# Patient Record
Sex: Male | Born: 1995 | Race: Black or African American | Hispanic: No | Marital: Single | Smoking: Current every day smoker
Health system: Southern US, Community
[De-identification: ages and names within clinical notes are randomized; demographics above are authoritative.]

## PROBLEM LIST (undated history)

## (undated) DIAGNOSIS — Z720 Tobacco use: Secondary | ICD-10-CM

## (undated) DIAGNOSIS — J9859 Other diseases of mediastinum, not elsewhere classified: Secondary | ICD-10-CM

---
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
## 2016-02-17 DIAGNOSIS — W3400XA Accidental discharge from unspecified firearms or gun, initial encounter: Secondary | ICD-10-CM

## 2016-02-17 HISTORY — DX: Accidental discharge from unspecified firearms or gun, initial encounter: W34.00XA

## 2016-10-19 DIAGNOSIS — T1490XA Injury, unspecified, initial encounter: Secondary | ICD-10-CM | POA: Insufficient documentation

## 2020-04-01 ENCOUNTER — Inpatient Hospital Stay (HOSPITAL_COMMUNITY)
Admission: EM | Admit: 2020-04-01 | Discharge: 2020-04-10 | DRG: 820 | Attending: Internal Medicine | Admitting: Internal Medicine

## 2020-04-01 ENCOUNTER — Inpatient Hospital Stay (HOSPITAL_COMMUNITY)

## 2020-04-01 ENCOUNTER — Emergency Department (HOSPITAL_COMMUNITY)

## 2020-04-01 ENCOUNTER — Other Ambulatory Visit: Payer: Self-pay

## 2020-04-01 ENCOUNTER — Encounter (HOSPITAL_COMMUNITY): Payer: Self-pay | Admitting: *Deleted

## 2020-04-01 DIAGNOSIS — C801 Malignant (primary) neoplasm, unspecified: Secondary | ICD-10-CM

## 2020-04-01 DIAGNOSIS — M79604 Pain in right leg: Secondary | ICD-10-CM | POA: Diagnosis present

## 2020-04-01 DIAGNOSIS — C8192 Hodgkin lymphoma, unspecified, intrathoracic lymph nodes: Principal | ICD-10-CM | POA: Diagnosis present

## 2020-04-01 DIAGNOSIS — Z79899 Other long term (current) drug therapy: Secondary | ICD-10-CM

## 2020-04-01 DIAGNOSIS — Z6820 Body mass index (BMI) 20.0-20.9, adult: Secondary | ICD-10-CM

## 2020-04-01 DIAGNOSIS — F17200 Nicotine dependence, unspecified, uncomplicated: Secondary | ICD-10-CM | POA: Diagnosis present

## 2020-04-01 DIAGNOSIS — F913 Oppositional defiant disorder: Secondary | ICD-10-CM | POA: Diagnosis present

## 2020-04-01 DIAGNOSIS — D75839 Thrombocytosis, unspecified: Secondary | ICD-10-CM | POA: Diagnosis present

## 2020-04-01 DIAGNOSIS — J9859 Other diseases of mediastinum, not elsewhere classified: Secondary | ICD-10-CM | POA: Diagnosis present

## 2020-04-01 DIAGNOSIS — F1210 Cannabis abuse, uncomplicated: Secondary | ICD-10-CM | POA: Diagnosis present

## 2020-04-01 DIAGNOSIS — R748 Abnormal levels of other serum enzymes: Secondary | ICD-10-CM | POA: Diagnosis present

## 2020-04-01 DIAGNOSIS — F319 Bipolar disorder, unspecified: Secondary | ICD-10-CM | POA: Diagnosis present

## 2020-04-01 DIAGNOSIS — C8198 Hodgkin lymphoma, unspecified, lymph nodes of multiple sites: Secondary | ICD-10-CM | POA: Diagnosis not present

## 2020-04-01 DIAGNOSIS — I314 Cardiac tamponade: Secondary | ICD-10-CM | POA: Diagnosis present

## 2020-04-01 DIAGNOSIS — Z9889 Other specified postprocedural states: Secondary | ICD-10-CM

## 2020-04-01 DIAGNOSIS — I313 Pericardial effusion (noninflammatory): Secondary | ICD-10-CM | POA: Diagnosis present

## 2020-04-01 DIAGNOSIS — M79605 Pain in left leg: Secondary | ICD-10-CM | POA: Diagnosis present

## 2020-04-01 DIAGNOSIS — Z20822 Contact with and (suspected) exposure to covid-19: Secondary | ICD-10-CM | POA: Diagnosis present

## 2020-04-01 DIAGNOSIS — J90 Pleural effusion, not elsewhere classified: Secondary | ICD-10-CM | POA: Diagnosis present

## 2020-04-01 DIAGNOSIS — R7401 Elevation of levels of liver transaminase levels: Secondary | ICD-10-CM | POA: Diagnosis not present

## 2020-04-01 DIAGNOSIS — Z5111 Encounter for antineoplastic chemotherapy: Secondary | ICD-10-CM | POA: Diagnosis not present

## 2020-04-01 DIAGNOSIS — J95811 Postprocedural pneumothorax: Secondary | ICD-10-CM

## 2020-04-01 DIAGNOSIS — J9811 Atelectasis: Secondary | ICD-10-CM | POA: Diagnosis present

## 2020-04-01 DIAGNOSIS — E46 Unspecified protein-calorie malnutrition: Secondary | ICD-10-CM | POA: Diagnosis present

## 2020-04-01 DIAGNOSIS — C8172 Other classical Hodgkin lymphoma, intrathoracic lymph nodes: Secondary | ICD-10-CM

## 2020-04-01 DIAGNOSIS — G4700 Insomnia, unspecified: Secondary | ICD-10-CM | POA: Diagnosis present

## 2020-04-01 DIAGNOSIS — F411 Generalized anxiety disorder: Secondary | ICD-10-CM | POA: Diagnosis present

## 2020-04-01 DIAGNOSIS — Z7189 Other specified counseling: Secondary | ICD-10-CM

## 2020-04-01 DIAGNOSIS — F909 Attention-deficit hyperactivity disorder, unspecified type: Secondary | ICD-10-CM | POA: Diagnosis present

## 2020-04-01 DIAGNOSIS — B159 Hepatitis A without hepatic coma: Secondary | ICD-10-CM | POA: Diagnosis present

## 2020-04-01 DIAGNOSIS — I3131 Malignant pericardial effusion in diseases classified elsewhere: Secondary | ICD-10-CM

## 2020-04-01 DIAGNOSIS — R000 Tachycardia, unspecified: Secondary | ICD-10-CM | POA: Diagnosis not present

## 2020-04-01 DIAGNOSIS — R609 Edema, unspecified: Secondary | ICD-10-CM | POA: Diagnosis not present

## 2020-04-01 DIAGNOSIS — J9601 Acute respiratory failure with hypoxia: Secondary | ICD-10-CM | POA: Diagnosis present

## 2020-04-01 DIAGNOSIS — D72829 Elevated white blood cell count, unspecified: Secondary | ICD-10-CM | POA: Diagnosis not present

## 2020-04-01 DIAGNOSIS — R0600 Dyspnea, unspecified: Secondary | ICD-10-CM

## 2020-04-01 HISTORY — DX: Other diseases of mediastinum, not elsewhere classified: J98.59

## 2020-04-01 HISTORY — DX: Tobacco use: Z72.0

## 2020-04-01 LAB — COMPREHENSIVE METABOLIC PANEL
ALT: 38 U/L (ref 0–44)
AST: 25 U/L (ref 15–41)
Albumin: 3 g/dL — ABNORMAL LOW (ref 3.5–5.0)
Alkaline Phosphatase: 76 U/L (ref 38–126)
Anion gap: 11 (ref 5–15)
BUN: 8 mg/dL (ref 6–20)
CO2: 27 mmol/L (ref 22–32)
Calcium: 9 mg/dL (ref 8.9–10.3)
Chloride: 102 mmol/L (ref 98–111)
Creatinine, Ser: 0.7 mg/dL (ref 0.61–1.24)
GFR, Estimated: >60 mL/min (ref >60)
Glucose, Bld: 88 mg/dL (ref 70–99)
Potassium: 4.1 mmol/L (ref 3.5–5.1)
Sodium: 140 mmol/L (ref 135–145)
Total Bilirubin: 0.8 mg/dL (ref 0.3–1.2)
Total Protein: 7.5 g/dL (ref 6.5–8.1)

## 2020-04-01 LAB — CBC WITH DIFFERENTIAL/PLATELET
Abs Immature Granulocytes: 0.08 10*3/uL — ABNORMAL HIGH (ref 0.00–0.07)
Basophils Absolute: 0.1 10*3/uL (ref 0.0–0.1)
Basophils Relative: 1 %
Eosinophils Absolute: 0.2 10*3/uL (ref 0.0–0.5)
Eosinophils Relative: 2 %
HCT: 38.4 % — ABNORMAL LOW (ref 39.0–52.0)
Hemoglobin: 11.5 g/dL — ABNORMAL LOW (ref 13.0–17.0)
Immature Granulocytes: 1 %
Lymphocytes Relative: 15 %
Lymphs Abs: 1.3 10*3/uL (ref 0.7–4.0)
MCH: 22.2 pg — ABNORMAL LOW (ref 26.0–34.0)
MCHC: 29.9 g/dL — ABNORMAL LOW (ref 30.0–36.0)
MCV: 74 fL — ABNORMAL LOW (ref 80.0–100.0)
Monocytes Absolute: 1 10*3/uL (ref 0.1–1.0)
Monocytes Relative: 12 %
Neutro Abs: 5.8 10*3/uL (ref 1.7–7.7)
Neutrophils Relative %: 69 %
Platelets: 1053 10*3/uL (ref 150–400)
RBC: 5.19 MIL/uL (ref 4.22–5.81)
RDW: 18.5 % — ABNORMAL HIGH (ref 11.5–15.5)
WBC: 8.4 10*3/uL (ref 4.0–10.5)
nRBC: 0 % (ref 0.0–0.2)

## 2020-04-01 LAB — LACTATE DEHYDROGENASE: LDH: 188 U/L (ref 98–192)

## 2020-04-01 LAB — GLUCOSE, PLEURAL OR PERITONEAL FLUID: Glucose, Fluid: 78 mg/dL

## 2020-04-01 LAB — RESP PANEL BY RT-PCR (FLU A&B, COVID) ARPGX2
Influenza A by PCR: NEGATIVE
Influenza B by PCR: NEGATIVE
SARS Coronavirus 2 by RT PCR: NEGATIVE

## 2020-04-01 LAB — ALBUMIN, PLEURAL OR PERITONEAL FLUID: Albumin, Fluid: 1.8 g/dL

## 2020-04-01 LAB — BODY FLUID CELL COUNT WITH DIFFERENTIAL
Eos, Fluid: 4 %
Lymphs, Fluid: 44 %
Monocyte-Macrophage-Serous Fluid: 20 % — ABNORMAL LOW (ref 50–90)
Neutrophil Count, Fluid: 32 % — ABNORMAL HIGH (ref 0–25)
Total Nucleated Cell Count, Fluid: 283 cu mm (ref 0–1000)

## 2020-04-01 LAB — TSH: TSH: 3.203 u[IU]/mL (ref 0.350–4.500)

## 2020-04-01 LAB — LACTATE DEHYDROGENASE, PLEURAL OR PERITONEAL FLUID: LD, Fluid: 109 U/L — ABNORMAL HIGH (ref 3–23)

## 2020-04-01 LAB — PROTIME-INR
INR: 1.4 — ABNORMAL HIGH (ref 0.8–1.2)
Prothrombin Time: 16.9 seconds — ABNORMAL HIGH (ref 11.4–15.2)

## 2020-04-01 LAB — HIV ANTIBODY (ROUTINE TESTING W REFLEX): HIV Screen 4th Generation wRfx: NONREACTIVE

## 2020-04-01 LAB — C-REACTIVE PROTEIN: CRP: 9.6 mg/dL — ABNORMAL HIGH (ref <1.0)

## 2020-04-01 LAB — SEDIMENTATION RATE: Sed Rate: 34 mm/hr — ABNORMAL HIGH (ref 0–16)

## 2020-04-01 LAB — PROTEIN, PLEURAL OR PERITONEAL FLUID: Total protein, fluid: 4.1 g/dL

## 2020-04-01 MED ORDER — ACETAMINOPHEN 325 MG PO TABS
650.0000 mg | ORAL_TABLET | Freq: Four times a day (QID) | ORAL | Status: DC | PRN
  Administered 2020-04-06 – 2020-04-08 (×2): 650 mg via ORAL
  Filled 2020-04-01 (×2): qty 2

## 2020-04-01 MED ORDER — MORPHINE SULFATE (PF) 2 MG/ML IV SOLN
2.0000 mg | Freq: Once | INTRAVENOUS | Status: AC
  Administered 2020-04-01: 2 mg via INTRAVENOUS
  Filled 2020-04-01: qty 1

## 2020-04-01 MED ORDER — IOHEXOL 300 MG/ML  SOLN
75.0000 mL | Freq: Once | INTRAMUSCULAR | Status: AC | PRN
  Administered 2020-04-01: 75 mL via INTRAVENOUS

## 2020-04-01 MED ORDER — SODIUM CHLORIDE 0.9% FLUSH
3.0000 mL | Freq: Two times a day (BID) | INTRAVENOUS | Status: DC
  Administered 2020-04-01 – 2020-04-10 (×15): 3 mL via INTRAVENOUS

## 2020-04-01 MED ORDER — ALBUTEROL SULFATE HFA 108 (90 BASE) MCG/ACT IN AERS
2.0000 | INHALATION_SPRAY | RESPIRATORY_TRACT | Status: DC | PRN
  Administered 2020-04-01: 2 via RESPIRATORY_TRACT
  Filled 2020-04-01: qty 6.7

## 2020-04-01 MED ORDER — POLYETHYLENE GLYCOL 3350 17 G PO PACK
17.0000 g | PACK | Freq: Every day | ORAL | Status: DC | PRN
  Administered 2020-04-05: 17 g via ORAL
  Filled 2020-04-01: qty 1

## 2020-04-01 MED ORDER — MORPHINE SULFATE (PF) 2 MG/ML IV SOLN
2.0000 mg | INTRAVENOUS | Status: DC | PRN
  Administered 2020-04-01 – 2020-04-05 (×7): 2 mg via INTRAVENOUS
  Filled 2020-04-01 (×11): qty 1

## 2020-04-01 MED ORDER — ACETAMINOPHEN 650 MG RE SUPP: 650.0000 mg | Freq: Four times a day (QID) | RECTAL | Status: DC | PRN

## 2020-04-01 MED ORDER — HYDROCODONE-ACETAMINOPHEN 5-325 MG PO TABS
1.0000 | ORAL_TABLET | ORAL | Status: DC | PRN
  Administered 2020-04-01 (×2): 2 via ORAL
  Administered 2020-04-02 – 2020-04-04 (×2): 1 via ORAL
  Administered 2020-04-05 – 2020-04-06 (×3): 2 via ORAL
  Filled 2020-04-01 (×4): qty 2
  Filled 2020-04-01 (×2): qty 1
  Filled 2020-04-01 (×2): qty 2

## 2020-04-01 MED ORDER — ENOXAPARIN SODIUM 40 MG/0.4ML ~~LOC~~ SOLN
40.0000 mg | SUBCUTANEOUS | Status: DC
  Administered 2020-04-01 – 2020-04-07 (×6): 40 mg via SUBCUTANEOUS
  Filled 2020-04-01 (×10): qty 0.4

## 2020-04-01 MED ORDER — IOHEXOL 300 MG/ML  SOLN
100.0000 mL | Freq: Once | INTRAMUSCULAR | Status: AC | PRN
  Administered 2020-04-01: 100 mL via INTRAVENOUS

## 2020-04-01 NOTE — ED Notes (Signed)
Care assumed. Report received from [place], [place]. Pt states his pain is 7/10, pain medications will be administered per [facility]

## 2020-04-01 NOTE — ED Notes (Signed)
Platelets 1,053, MD Horton notified via phone.
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
## 2020-04-01 NOTE — ED Notes (Signed)
Attempt to call report. RN to call back in 5-10 minutes

## 2020-04-01 NOTE — ED Notes (Signed)
At patient's bedside with Dr. Erin Fulling for Thoracentesis, consent signed by patient.
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
## 2020-04-01 NOTE — ED Notes (Signed)
Begin procedure

## 2020-04-01 NOTE — Procedures (Signed)
Thoracentesis  Procedure Note  Thomas Sanders  [id number]  [birth year]  Date:04/01/20  Time:5:18 PM   Provider Performing:Deniyah Dillavou B Amogh Komatsu   Procedure: Thoracentesis with imaging guidance (35825)  Indication(s) Pleural Effusion  Consent Risks of the procedure as well as the alternatives and risks of each were explained to the patient and/or caregiver.  Consent for the procedure was obtained and is signed in the bedside chart  Anesthesia Topical only with 1% lidocaine    Time Out Verified patient identification, verified procedure, site/side was marked, verified correct patient position, special equipment/implants available, medications/allergies/relevant history reviewed, required imaging and test results available.   Sterile Technique Maximal sterile technique including full sterile barrier drape, hand hygiene, sterile gown, sterile gloves, mask, hair covering, sterile ultrasound probe cover (if used).  Procedure Description Ultrasound was used to identify appropriate pleural anatomy for placement and overlying skin marked.  Area of drainage cleaned and draped in sterile fashion. Lidocaine was used to anesthetize the skin and subcutaneous tissue.  1400 cc's of neon yellow appearing fluid was drained from the right pleural space. Catheter then removed and bandaid applied to site.   Complications/Tolerance None; patient tolerated the procedure well. Chest X-ray is ordered to confirm no post-procedural complication.   EBL Minimal   Specimen(s) Pleural fluid

## 2020-04-01 NOTE — ED Notes (Signed)
End procdure
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
## 2020-04-01 NOTE — ED Triage Notes (Signed)
BIB [facility] from jail due to Right Pleural Effusion, pt states he has been feeling bad since 03/21/20

## 2020-04-02 ENCOUNTER — Inpatient Hospital Stay (HOSPITAL_COMMUNITY)

## 2020-04-02 ENCOUNTER — Encounter (HOSPITAL_COMMUNITY): Payer: Self-pay | Admitting: Internal Medicine

## 2020-04-02 DIAGNOSIS — J9859 Other diseases of mediastinum, not elsewhere classified: Secondary | ICD-10-CM | POA: Diagnosis not present

## 2020-04-02 LAB — BASIC METABOLIC PANEL
Anion gap: 10 (ref 5–15)
BUN: 10 mg/dL (ref 6–20)
CO2: 23 mmol/L (ref 22–32)
Calcium: 8.8 mg/dL — ABNORMAL LOW (ref 8.9–10.3)
Chloride: 102 mmol/L (ref 98–111)
Creatinine, Ser: 0.62 mg/dL (ref 0.61–1.24)
GFR, Estimated: >60 mL/min (ref >60)
Glucose, Bld: 85 mg/dL (ref 70–99)
Potassium: 4.3 mmol/L (ref 3.5–5.1)
Sodium: 135 mmol/L (ref 135–145)

## 2020-04-02 LAB — CBC
HCT: 37.7 % — ABNORMAL LOW (ref 39.0–52.0)
Hemoglobin: 11 g/dL — ABNORMAL LOW (ref 13.0–17.0)
MCH: 21.9 pg — ABNORMAL LOW (ref 26.0–34.0)
MCHC: 29.2 g/dL — ABNORMAL LOW (ref 30.0–36.0)
MCV: 75.1 fL — ABNORMAL LOW (ref 80.0–100.0)
Platelets: 953 10*3/uL (ref 150–400)
RBC: 5.02 MIL/uL (ref 4.22–5.81)
RDW: 18.1 % — ABNORMAL HIGH (ref 11.5–15.5)
WBC: 11.2 10*3/uL — ABNORMAL HIGH (ref 4.0–10.5)
nRBC: 0 % (ref 0.0–0.2)

## 2020-04-02 LAB — TRIGLYCERIDES, BODY FLUIDS: Triglycerides, Fluid: 23 mg/dL

## 2020-04-02 LAB — PATHOLOGIST SMEAR REVIEW

## 2020-04-02 LAB — AMYLASE, PLEURAL OR PERITONEAL FLUID: Amylase, Fluid: 16 U/L

## 2020-04-02 MED ORDER — MIDAZOLAM HCL 2 MG/2ML IJ SOLN
INTRAMUSCULAR | Status: AC
  Filled 2020-04-02: qty 4

## 2020-04-02 MED ORDER — SODIUM CHLORIDE 0.9 % IV SOLN
INTRAVENOUS | Status: AC
  Filled 2020-04-02: qty 250

## 2020-04-02 MED ORDER — FENTANYL CITRATE (PF) 100 MCG/2ML IJ SOLN
INTRAMUSCULAR | Status: AC
  Filled 2020-04-02: qty 4

## 2020-04-02 MED ORDER — FENTANYL CITRATE (PF) 100 MCG/2ML IJ SOLN
INTRAMUSCULAR | Status: AC | PRN
  Administered 2020-04-02 (×2): 50 ug via INTRAVENOUS

## 2020-04-02 MED ORDER — MIDAZOLAM HCL 2 MG/2ML IJ SOLN
INTRAMUSCULAR | Status: AC | PRN
  Administered 2020-04-02 (×2): 2 mg via INTRAVENOUS

## 2020-04-02 MED ORDER — LORAZEPAM 2 MG/ML IJ SOLN
1.0000 mg | Freq: Once | INTRAMUSCULAR | Status: AC
  Administered 2020-04-02: 1 mg via INTRAVENOUS
  Filled 2020-04-02: qty 1

## 2020-04-02 MED ORDER — IPRATROPIUM-ALBUTEROL 0.5-2.5 (3) MG/3ML IN SOLN
3.0000 mL | Freq: Four times a day (QID) | RESPIRATORY_TRACT | Status: DC | PRN
  Administered 2020-04-02 – 2020-04-03 (×3): 3 mL via RESPIRATORY_TRACT
  Filled 2020-04-02 (×3): qty 3

## 2020-04-02 MED ORDER — LORAZEPAM 2 MG/ML IJ SOLN
1.0000 mg | Freq: Four times a day (QID) | INTRAMUSCULAR | Status: DC | PRN
  Administered 2020-04-02 – 2020-04-03 (×3): 1 mg via INTRAVENOUS
  Filled 2020-04-02 (×4): qty 1

## 2020-04-02 MED ORDER — LIDOCAINE HCL (PF) 1 % IJ SOLN
INTRAMUSCULAR | Status: AC | PRN
  Administered 2020-04-02: 10 mL via INTRADERMAL

## 2020-04-02 NOTE — Progress Notes (Signed)
Patient c/o of worsening Shortness of breath and back pain. O2 currently at 98% and patient is on 2L of O2. Paged on call provider. Will continue to monitor.  Provider ordered a new chest xray  PRN morphine 2 mg given.
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
## 2020-04-02 NOTE — Procedures (Signed)
Interventional Radiology Procedure Note  Procedure: CT BX MEDIASTINAL MASS    Complications: None  Estimated Blood Loss:  MIN  Findings: 7 G CORES    Tamera Punt, MD

## 2020-04-02 NOTE — TOC Progression Note (Signed)
Transition of Care [facility]) - Progression Note    Patient Details  Name: Thomas Sanders MRN: [id number] Date of Birth: [birth year]  Transition of Care [facility]) CM/SW Contact  Ross Ludwig, [place] Phone Number: 04/02/2020, 4:54 PM  Clinical Narrative:     CSW received consult for patient being from jail.  CSW will follow if any needs arise.  Plan is for patient to return to jail.        Expected Discharge Plan and Services                                                 Social Determinants of Health (SDOH) Interventions    Readmission Risk Interventions No flowsheet data found.

## 2020-04-02 NOTE — Plan of Care (Signed)
  Problem: Health Behavior/Discharge Planning: Goal: Ability to manage health-related needs will improve Outcome: Progressing   Problem: Clinical Measurements: Goal: Will remain free from infection Outcome: Progressing   Problem: Coping: Goal: Level of anxiety will decrease Outcome: Progressing
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
## 2020-04-02 NOTE — CV Procedure (Signed)
Echocardiogram not completed. First attempt the patient was experiencing shortness of breath and chest pain. Second attempt he was in IR for a biopsy.  Darlina Sicilian RDCS

## 2020-04-02 NOTE — Progress Notes (Signed)
Oncology short note  CT abd/pelvis and CT neck -- no other evidence of metastatic disease ECHO pending IR guided biopsy done today-- pathology pending.  -appreciate excellent hospital medicine cares -oncology will f/u once pathology results available.  Sullivan Lone MD

## 2020-04-03 ENCOUNTER — Inpatient Hospital Stay (HOSPITAL_COMMUNITY)

## 2020-04-03 ENCOUNTER — Encounter (HOSPITAL_COMMUNITY): Admission: EM | Payer: Self-pay | Source: Home / Self Care | Attending: Internal Medicine

## 2020-04-03 ENCOUNTER — Encounter (HOSPITAL_COMMUNITY): Payer: Self-pay | Admitting: Internal Medicine

## 2020-04-03 DIAGNOSIS — D75839 Thrombocytosis, unspecified: Secondary | ICD-10-CM | POA: Diagnosis not present

## 2020-04-03 DIAGNOSIS — I3131 Malignant pericardial effusion in diseases classified elsewhere: Secondary | ICD-10-CM

## 2020-04-03 DIAGNOSIS — I314 Cardiac tamponade: Secondary | ICD-10-CM

## 2020-04-03 DIAGNOSIS — I313 Pericardial effusion (noninflammatory): Secondary | ICD-10-CM | POA: Diagnosis not present

## 2020-04-03 DIAGNOSIS — J9859 Other diseases of mediastinum, not elsewhere classified: Secondary | ICD-10-CM | POA: Diagnosis not present

## 2020-04-03 DIAGNOSIS — J90 Pleural effusion, not elsewhere classified: Secondary | ICD-10-CM | POA: Diagnosis not present

## 2020-04-03 DIAGNOSIS — C801 Malignant (primary) neoplasm, unspecified: Secondary | ICD-10-CM

## 2020-04-03 HISTORY — PX: PERICARDIOCENTESIS: CATH118255

## 2020-04-03 LAB — GRAM STAIN

## 2020-04-03 LAB — BODY FLUID CELL COUNT WITH DIFFERENTIAL
Eos, Fluid: 3 %
Lymphs, Fluid: 30 %
Monocyte-Macrophage-Serous Fluid: 9 % — ABNORMAL LOW (ref 50–90)
Neutrophil Count, Fluid: 58 % — ABNORMAL HIGH (ref 0–25)
Other Cells, Fluid: 1 %
Total Nucleated Cell Count, Fluid: 1040 cu mm — ABNORMAL HIGH (ref 0–1000)

## 2020-04-03 LAB — BLOOD GAS, ARTERIAL
Acid-Base Excess: 0.9 mmol/L (ref 0.0–2.0)
Bicarbonate: 26.2 mmol/L (ref 20.0–28.0)
Drawn by: 422461
FIO2: 32
O2 Content: 3 L/min
O2 Saturation: 94.9 %
Patient temperature: 98.3
pCO2 arterial: 47.5 mmHg (ref 32.0–48.0)
pH, Arterial: 7.359 (ref 7.350–7.450)
pO2, Arterial: 83.9 mmHg (ref 83.0–108.0)

## 2020-04-03 LAB — ECHOCARDIOGRAM COMPLETE
Area-P 1/2: 5.02 cm2
Height: 74.5 in
S' Lateral: 2.5 cm
Weight: 2768 oz

## 2020-04-03 LAB — BASIC METABOLIC PANEL
Anion gap: 10 (ref 5–15)
BUN: 9 mg/dL (ref 6–20)
CO2: 27 mmol/L (ref 22–32)
Calcium: 8.7 mg/dL — ABNORMAL LOW (ref 8.9–10.3)
Chloride: 100 mmol/L (ref 98–111)
Creatinine, Ser: 0.7 mg/dL (ref 0.61–1.24)
GFR, Estimated: >60 mL/min (ref >60)
Glucose, Bld: 100 mg/dL — ABNORMAL HIGH (ref 70–99)
Potassium: 4.4 mmol/L (ref 3.5–5.1)
Sodium: 137 mmol/L (ref 135–145)

## 2020-04-03 LAB — CBC
HCT: 38 % — ABNORMAL LOW (ref 39.0–52.0)
Hemoglobin: 11.4 g/dL — ABNORMAL LOW (ref 13.0–17.0)
MCH: 22.3 pg — ABNORMAL LOW (ref 26.0–34.0)
MCHC: 30 g/dL (ref 30.0–36.0)
MCV: 74.4 fL — ABNORMAL LOW (ref 80.0–100.0)
Platelets: 951 10*3/uL (ref 150–400)
RBC: 5.11 MIL/uL (ref 4.22–5.81)
RDW: 17.8 % — ABNORMAL HIGH (ref 11.5–15.5)
WBC: 14.2 10*3/uL — ABNORMAL HIGH (ref 4.0–10.5)
nRBC: 0 % (ref 0.0–0.2)

## 2020-04-03 LAB — CALCITONIN: Calcitonin: <2 pg/mL (ref 0.0–8.4)

## 2020-04-03 LAB — PROCALCITONIN: Procalcitonin: <0.1 ng/mL

## 2020-04-03 LAB — BRAIN NATRIURETIC PEPTIDE: B Natriuretic Peptide: 22.4 pg/mL (ref 0.0–100.0)

## 2020-04-03 LAB — LACTATE DEHYDROGENASE: LDH: 222 U/L — ABNORMAL HIGH (ref 98–192)

## 2020-04-03 LAB — CHOLESTEROL, BODY FLUID: Cholesterol, Fluid: 55 mg/dL

## 2020-04-03 LAB — MRSA PCR SCREENING: MRSA by PCR: NEGATIVE

## 2020-04-03 LAB — BETA HCG QUANT (REF LAB): hCG Quant: <1 m[IU]/mL (ref 0–3)

## 2020-04-03 SURGERY — PERICARDIOCENTESIS
Anesthesia: LOCAL

## 2020-04-03 MED ORDER — LORAZEPAM 2 MG/ML IJ SOLN: 1.0000 mg | INTRAMUSCULAR | Status: DC

## 2020-04-03 MED ORDER — ADULT MULTIVITAMIN W/MINERALS CH
1.0000 | ORAL_TABLET | Freq: Every day | ORAL | Status: DC
  Administered 2020-04-04 – 2020-04-10 (×7): 1 via ORAL
  Filled 2020-04-03 (×8): qty 1

## 2020-04-03 MED ORDER — SODIUM CHLORIDE 0.9 % IV SOLN: Freq: Once | INTRAVENOUS | Status: DC

## 2020-04-03 MED ORDER — CHLORHEXIDINE GLUCONATE CLOTH 2 % EX PADS
6.0000 | MEDICATED_PAD | Freq: Every day | CUTANEOUS | Status: DC
  Administered 2020-04-03 – 2020-04-09 (×5): 6 via TOPICAL

## 2020-04-03 MED ORDER — HEPARIN (PORCINE) IN NACL 1000-0.9 UT/500ML-% IV SOLN
INTRAVENOUS | Status: AC
  Filled 2020-04-03: qty 500

## 2020-04-03 MED ORDER — ENSURE ENLIVE PO LIQD
237.0000 mL | ORAL | Status: DC
  Administered 2020-04-03 – 2020-04-09 (×7): 237 mL via ORAL

## 2020-04-03 MED ORDER — FENTANYL CITRATE (PF) 100 MCG/2ML IJ SOLN
INTRAMUSCULAR | Status: AC
  Filled 2020-04-03: qty 2

## 2020-04-03 MED ORDER — LIDOCAINE HCL (PF) 1 % IJ SOLN
INTRAMUSCULAR | Status: AC
  Filled 2020-04-03: qty 30

## 2020-04-03 MED ORDER — GUAIFENESIN-DM 100-10 MG/5ML PO SYRP
5.0000 mL | ORAL_SOLUTION | ORAL | Status: AC | PRN
  Administered 2020-04-04 (×4): 5 mL via ORAL
  Filled 2020-04-03 (×4): qty 5

## 2020-04-03 MED ORDER — PROSOURCE PLUS PO LIQD
30.0000 mL | Freq: Every day | ORAL | Status: DC
  Administered 2020-04-06 – 2020-04-07 (×2): 30 mL via ORAL
  Filled 2020-04-03 (×3): qty 30

## 2020-04-03 MED ORDER — METHYLPREDNISOLONE SODIUM SUCC 40 MG IJ SOLR
40.0000 mg | Freq: Two times a day (BID) | INTRAMUSCULAR | Status: DC
  Administered 2020-04-03 – 2020-04-05 (×5): 40 mg via INTRAVENOUS
  Filled 2020-04-03 (×5): qty 1

## 2020-04-03 MED ORDER — HEPARIN (PORCINE) IN NACL 1000-0.9 UT/500ML-% IV SOLN
INTRAVENOUS | Status: DC | PRN
  Administered 2020-04-03: 500 mL

## 2020-04-03 MED ORDER — LIDOCAINE HCL (PF) 1 % IJ SOLN
INTRAMUSCULAR | Status: DC | PRN
  Administered 2020-04-03: 10 mL

## 2020-04-03 MED ORDER — FENTANYL CITRATE (PF) 100 MCG/2ML IJ SOLN
INTRAMUSCULAR | Status: DC | PRN
  Administered 2020-04-03: 25 ug via INTRAVENOUS

## 2020-04-03 MED ORDER — ORAL CARE MOUTH RINSE
15.0000 mL | Freq: Two times a day (BID) | OROMUCOSAL | Status: DC
  Administered 2020-04-03 – 2020-04-04 (×2): 15 mL via OROMUCOSAL

## 2020-04-03 MED ORDER — LORAZEPAM 2 MG/ML IJ SOLN
1.0000 mg | Freq: Once | INTRAMUSCULAR | Status: AC
  Administered 2020-04-03: 1 mg via INTRAVENOUS

## 2020-04-03 MED ORDER — BOOST / RESOURCE BREEZE PO LIQD CUSTOM
1.0000 | ORAL | Status: DC
  Administered 2020-04-04 – 2020-04-10 (×5): 1 via ORAL

## 2020-04-03 MED ORDER — LORAZEPAM 2 MG/ML IJ SOLN
1.0000 mg | INTRAMUSCULAR | Status: DC | PRN
  Administered 2020-04-04 – 2020-04-09 (×3): 1 mg via INTRAVENOUS
  Filled 2020-04-03 (×3): qty 1

## 2020-04-03 SURGICAL SUPPLY — 7 items
CATH ANGIO 5F PIGTAIL 65CM (CATHETERS) ×2 IMPLANT
KIT PERIVAC ST CATH 8.3FR (TRAY / TRAY PROCEDURE) ×2 IMPLANT
PACK CARDIAC CATHETERIZATION (CUSTOM PROCEDURE TRAY) ×2 IMPLANT
PROTECTION STATION PRESSURIZED (MISCELLANEOUS) ×2
SHEATH PINNACLE 4F 10CM (SHEATH) ×2 IMPLANT
STATION PROTECTION PRESSURIZED (MISCELLANEOUS) ×1 IMPLANT
WIRE ROSEN-J .035X260CM (WIRE) ×2 IMPLANT

## 2020-04-03 NOTE — Progress Notes (Signed)
Called by nursing to assess drainage around recent pericardiocentesis site. Pericardial drain is in place. There is oozing around the incision site. This appears to be slow and is heavy per nursing report. Unfortunately he had a difficult procedure with difficulty advancing the catheter at the incision site. I suspect the access flows due to enlargement of the puncture site. Hopefully this will slow overnight. Would recommend to continue with gauze dressings to the affected area. Would recommend to continue to check the site periodically every 2 hours. If there is a change in his vital status including hypotension, worsening shortness of breath or worsening tachycardia, please notify cardiology on-call. Furthermore, if you have concerns about leakage around the drain site please discuss with cardiology on-call.  Thomas Bells T. Audie Box, MD, [place]  [address] [place], [place] [address] [phone]  6:53 PM

## 2020-04-03 NOTE — Progress Notes (Signed)
Echocardiogram 2D Echocardiogram has been performed as part of pericardiocentesis.  [place] Deputy Ritaj Dullea 04/03/2020, 5:14 PM

## 2020-04-03 NOTE — Progress Notes (Signed)
Audible wheezing lessoned. Watching TV

## 2020-04-03 NOTE — Interval H&P Note (Signed)
History and Physical Interval Note:  04/03/2020 3:44 PM  Thomas Sanders  has presented today for surgery, with the diagnosis of pericardial effusion.  The various methods of treatment have been discussed with the patient and family. After consideration of risks, benefits and other options for treatment, the patient has consented to  Procedure(s): PERICARDIOCENTESIS (N/A) as a surgical intervention.  The patient's history has been reviewed, patient examined, no change in status, stable for surgery.  I have reviewed the patient's chart and labs.  Questions were answered to the patient's satisfaction.     Kathlyn [place]
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
## 2020-04-03 NOTE — Progress Notes (Signed)
Oncology short note  Chart reviewed. Patient with cardiac tamponade on ECHO -- transfer to [facility].  Called and discussed prelim pathology result with Dr. Vic Ripper --does not appear to be a germ cell tumor and does not appear to be a carcinoma.  It has been referred to Dr. Davina Poke from hematopathology side.  Cannot rule out lymphoma versus thymic tumor versus other etiology.  Additional testing pending no final pathology results available yet.  Pleural fluid differential not overtly lymphocytic. LDH not elevated. Somewhat elevated sedimentation rate and CRP.  PLAN -If no contraindications from a cardiology standpoint it would be reasonable to start the patient empirically on prednisone 60 mg p.o.(of Dexamethasone 10mg  q12h if cannot tolerate po) daily to try to shrink possible lymphoma.   -oncology awaiting final pathology results to define definitive treatment plan.  Sullivan Lone MD MS

## 2020-04-03 NOTE — Progress Notes (Signed)
Patient being transferred to [facility] due to patient condition. Discussed with CCM MD and Hospitalist aware. [facility] at bedside now to transfer. Patient going with his IV access still in place, [facility] RN aware and report given to her for transfer. If need to reach this RN for report once at [facility] please call [phone].
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
## 2020-04-03 NOTE — Progress Notes (Addendum)
04/03/2020   S:  I have seen and evaluated the patient for worsening respiratory status. Has mediastinal mass, malignant pleural and pericardial effusions.  Path on mass pending.  Worsening resp status despite thoracentesis and echo showing clear tamponade so sent to [facility].  O: Blood pressure 129/81, pulse (!) 124, temperature (!) 97.5 F (36.4 C), temperature source Oral, resp. rate (!) 33, height 6' 2.5" (1.892 m), weight 78.5 kg, SpO2 94 %.  Anxious young man in mild resp distress tripoding Absent breath sounds on R Heart sounds distant Ext warm Mentating  A:  Respiratory distress and worsening hemodynamics secondary to tamponade and airway compression from anterior mediastinal mass with pleural/pericardial involvement. Primary differential is thymic, lymphoma.   P:  Trial of pericardial drain sitting upright, he is unable to lie flat  If worsening respiratory status would do brief [facility] ECMO cannulation as intubating him would likely lead to complete cardiovascular collapse.  Cannulas can then be removed once tamponade is resolved.  Will do pleural drain once out of cath lab.  F/u mass path  Additional 45 mins cc time discussing case with ECMO team, cardiology, and Dr. Erin Fulling.  04/03/2020 Erskine Emery MD  Update: patient would like to hold off on pleural drain this evening.
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
## 2020-04-03 NOTE — Progress Notes (Signed)
  Echocardiogram 2D Echocardiogram has been performed.  Jennette Dubin 04/03/2020, 8:30 AM

## 2020-04-03 NOTE — Progress Notes (Signed)
    BRIEF OVERNIGHT PROGRESS REPORT    SUBJECTIVE: Patient with worsening SOB per RRT. Noted to be tachypneic, tachycardic and restless despite PRN administration of Lorazepam, morphine and bronchodilators.  OBJECTIVE: He is  afebrile with blood pressure 144/98 mm Hg and pulse rate 121 beats/min. There were no focal neurological deficits; he was alert and oriented x4, but restless with increased work of breathing.  ASSESSMENT & PLAN:  Acute Respiratory Failure secondary to right pleural effusion in a patient with Large anterior mediastinal mass and near complete right lung collapse as resultant effect of endobronchial compression. -S/p thoracentesis 2/14 by pulmonology - exudative effusion by lights - follow cytology, PH, fungal culture, body fluid culture, cholesterol body fluid -S/p CT guided biopsy  -Transfer to stepdown -Supplemental O2 as needed to maintain O2 saturations 88 to 92% -High risk for intubation -Follow intermittent ABG and chest x-ray as needed -As needed bronchodilators -Oncology following -PCCM consulted will evaluate for furthr management        Rufina Falco, BSN, MSN, DNP, CCRN,FNP-C  [facility] Nurse Practitioner  [facility]

## 2020-04-03 NOTE — Progress Notes (Signed)
[facility] Progress Note Patient Name: Thomas Sanders DOB: 1995/02/19 MRN: [id number]   Date of Service  04/03/2020  HPI/Events of Note  Patient with bouts of coughing, he is requesting a cough suppressant.  eICU Interventions  Robitussin DM ordered PRN cough.        Frederik Pear 04/03/2020, 11:38 PM

## 2020-04-03 NOTE — Progress Notes (Signed)
Audible wheezing ?

## 2020-04-04 ENCOUNTER — Encounter (HOSPITAL_COMMUNITY): Payer: Self-pay | Admitting: Cardiovascular Disease

## 2020-04-04 ENCOUNTER — Inpatient Hospital Stay (HOSPITAL_COMMUNITY)

## 2020-04-04 DIAGNOSIS — I313 Pericardial effusion (noninflammatory): Secondary | ICD-10-CM

## 2020-04-04 DIAGNOSIS — J9601 Acute respiratory failure with hypoxia: Secondary | ICD-10-CM

## 2020-04-04 DIAGNOSIS — C801 Malignant (primary) neoplasm, unspecified: Secondary | ICD-10-CM

## 2020-04-04 DIAGNOSIS — R000 Tachycardia, unspecified: Secondary | ICD-10-CM

## 2020-04-04 DIAGNOSIS — D72829 Elevated white blood cell count, unspecified: Secondary | ICD-10-CM

## 2020-04-04 DIAGNOSIS — J90 Pleural effusion, not elsewhere classified: Secondary | ICD-10-CM

## 2020-04-04 DIAGNOSIS — D75839 Thrombocytosis, unspecified: Secondary | ICD-10-CM | POA: Diagnosis not present

## 2020-04-04 DIAGNOSIS — I314 Cardiac tamponade: Secondary | ICD-10-CM

## 2020-04-04 DIAGNOSIS — J9859 Other diseases of mediastinum, not elsewhere classified: Secondary | ICD-10-CM | POA: Diagnosis not present

## 2020-04-04 LAB — BASIC METABOLIC PANEL
Anion gap: 8 (ref 5–15)
BUN: 7 mg/dL (ref 6–20)
CO2: 30 mmol/L (ref 22–32)
Calcium: 8.8 mg/dL — ABNORMAL LOW (ref 8.9–10.3)
Chloride: 98 mmol/L (ref 98–111)
Creatinine, Ser: 0.72 mg/dL (ref 0.61–1.24)
GFR, Estimated: >60 mL/min (ref >60)
Glucose, Bld: 113 mg/dL — ABNORMAL HIGH (ref 70–99)
Potassium: 4.4 mmol/L (ref 3.5–5.1)
Sodium: 136 mmol/L (ref 135–145)

## 2020-04-04 LAB — CBC
HCT: 39.8 % (ref 39.0–52.0)
Hemoglobin: 11.8 g/dL — ABNORMAL LOW (ref 13.0–17.0)
MCH: 22 pg — ABNORMAL LOW (ref 26.0–34.0)
MCHC: 29.6 g/dL — ABNORMAL LOW (ref 30.0–36.0)
MCV: 74.3 fL — ABNORMAL LOW (ref 80.0–100.0)
Platelets: 893 10*3/uL — ABNORMAL HIGH (ref 150–400)
RBC: 5.36 MIL/uL (ref 4.22–5.81)
RDW: 18 % — ABNORMAL HIGH (ref 11.5–15.5)
WBC: 14.6 10*3/uL — ABNORMAL HIGH (ref 4.0–10.5)
nRBC: 0 % (ref 0.0–0.2)

## 2020-04-04 LAB — PROTEIN, BODY FLUID (OTHER): Total Protein, Body Fluid Other: 4.7 g/dL

## 2020-04-04 LAB — SURGICAL PATHOLOGY

## 2020-04-04 LAB — PH, BODY FLUID
pH, Body Fluid: 7.5
pH, Body Fluid: 7.6

## 2020-04-04 LAB — ECHOCARDIOGRAM LIMITED
Height: 74.5 in
Height: 74.5 in
Weight: 2768 oz
Weight: 2768 oz

## 2020-04-04 LAB — GLUCOSE, BODY FLUID OTHER: Glucose, Body Fluid Other: 86 mg/dL

## 2020-04-04 LAB — AFP TUMOR MARKER: AFP, Serum, Tumor Marker: 2 ng/mL (ref 0.0–8.3)

## 2020-04-04 LAB — LD, BODY FLUID (OTHER): LD, Body Fluid: 355 IU/L

## 2020-04-04 LAB — CYTOLOGY - NON PAP

## 2020-04-04 MED ORDER — SERTRALINE HCL 25 MG PO TABS
50.0000 mg | ORAL_TABLET | Freq: Every day | ORAL | Status: DC
  Filled 2020-04-04 (×2): qty 1
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
## 2020-04-04 NOTE — Progress Notes (Signed)
Brief Oncology Note:  I called pathology to follow-up on the biopsy result.  Biopsy is still pending reviewed by Dr. Melina Copa.  No additional information was available today.  We will follow up with pathology again tomorrow.  Continue current steroids.   Mikey Bussing, DNP, AGPCNP-BC, AOCNP

## 2020-04-04 NOTE — Progress Notes (Addendum)
Cardiology Progress Note  Patient ID: Thomas Sanders MRN: [id number] DOB: [birth year] Date of Encounter: 04/04/2020  Primary Cardiologist: No primary care provider on file.  Subjective   Chief Complaint: Shortness of breath  HPI: 250 cc plus of output from a pericardial drain.  Feeling better.  Tachycardia improved.  Still a lot of drainage oozing from the incision site.  ROS:  All other ROS reviewed and negative. Pertinent positives noted in the HPI.     Inpatient Medications  Scheduled Meds: . (feeding supplement) PROSource Plus  30 mL Oral Daily  . Chlorhexidine Gluconate Cloth  6 each Topical Daily  . enoxaparin (LOVENOX) injection  40 mg Subcutaneous Q24H  . feeding supplement  1 Container Oral Q24H  . feeding supplement  237 mL Oral Q24H  . mouth rinse  15 mL Mouth Rinse BID  . methylPREDNISolone (SOLU-MEDROL) injection  40 mg Intravenous Q12H  . multivitamin with minerals  1 tablet Oral Daily  . sodium chloride flush  3 mL Intravenous Q12H   Continuous Infusions:  PRN Meds: acetaminophen **OR** acetaminophen, guaiFENesin-dextromethorphan, HYDROcodone-acetaminophen, ipratropium-albuterol, LORazepam, morphine injection, polyethylene glycol   Vital Signs   Vitals:   04/04/20 0500 04/04/20 0600 04/04/20 0700 04/04/20 0757  BP: 116/77 134/80 122/84   Pulse: (!) 106 (!) 101 92   Resp: 20 (!) 24 (!) 26   Temp:    98 F (36.7 C)  TempSrc:    Oral  SpO2: 99% 99% 98%   Weight:      Height:        Intake/Output Summary (Last 24 hours) at 04/04/2020 0817 Last data filed at 04/04/2020 0998 Gross per 24 hour  Intake 1200 ml  Output 2760 ml  Net -1560 ml   Last 3 Weights 04/01/2020  Weight (lbs) 173 lb  Weight (kg) 78.472 kg      Telemetry  Overnight telemetry shows sinus tachycardia in the low one hundreds, which I personally reviewed.   Physical Exam   Vitals:   04/04/20 0500 04/04/20 0600 04/04/20 0700 04/04/20 0757  BP: 116/77 134/80 122/84   Pulse: (!) 106  (!) 101 92   Resp: 20 (!) 24 (!) 26   Temp:    98 F (36.7 C)  TempSrc:    Oral  SpO2: 99% 99% 98%   Weight:      Height:         Intake/Output Summary (Last 24 hours) at 04/04/2020 0817 Last data filed at 04/04/2020 3382 Gross per 24 hour  Intake 1200 ml  Output 2760 ml  Net -1560 ml    Last 3 Weights 04/01/2020  Weight (lbs) 173 lb  Weight (kg) 78.472 kg    Body mass index is 21.91 kg/m.  General: Ill-appearing, tachypnea noted Head: Atraumatic, normal size  Eyes: PEERLA, EOMI  Neck: Supple, JVD 7 to 8 cm of water Endocrine: No thryomegaly Cardiac: Normal S1, S2; RRR; no murmurs, rubs, or gallops, distant heart sounds  Lungs: Diminished breath sounds in the left lung fields, absent breath sounds in the right lung field Abd: Soft, nontender, no hepatomegaly, pericardial drain in place in the upper epigastric area, oozing noted from the incision site Ext: No edema, pulses 2+ Musculoskeletal: No deformities, BUE and BLE strength normal and equal Skin: Warm and dry, no rashes   Neuro: Alert and oriented to person, place, time, and situation, CNII-XII grossly intact, no focal deficits  Psych: Normal mood and affect   Labs  High Sensitivity Troponin:  No results for  input(s): TROPONINIHS in the last 720 hours.   Cardiac EnzymesNo results for input(s): TROPONINI in the last 168 hours. No results for input(s): TROPIPOC in the last 168 hours.  Chemistry Recent Labs  Lab 04/01/20 1203 04/02/20 0525 04/03/20 0526 04/04/20 0006  NA 140 135 137 136  K 4.1 4.3 4.4 4.4  CL 102 102 100 98  CO2 27 23 27 30   GLUCOSE 88 85 100* 113*  BUN 8 10 9 7   CREATININE 0.70 0.62 0.70 0.72  CALCIUM 9.0 8.8* 8.7* 8.8*  PROT 7.5  --   --   --   ALBUMIN 3.0*  --   --   --   AST 25  --   --   --   ALT 38  --   --   --   ALKPHOS 76  --   --   --   BILITOT 0.8  --   --   --   GFRNONAA >60 >60 >60 >60  ANIONGAP 11 10 10 8     Hematology Recent Labs  Lab 04/02/20 0525 04/03/20 0526  04/04/20 0006  WBC 11.2* 14.2* 14.6*  RBC 5.02 5.11 5.36  HGB 11.0* 11.4* 11.8*  HCT 37.7* 38.0* 39.8  MCV 75.1* 74.4* 74.3*  MCH 21.9* 22.3* 22.0*  MCHC 29.2* 30.0 29.6*  RDW 18.1* 17.8* 18.0*  PLT 953* 951* 893*   BNP Recent Labs  Lab 04/03/20 1313  BNP 22.4    DDimer No results for input(s): DDIMER in the last 168 hours.   Radiology  CARDIAC CATHETERIZATION  Result Date: 04/03/2020 Successful pericardiocentesis via the right subxiphoid area with drainage of almost 900 mL of serous fluid mixed with white-colored clumps which caused frequent clogging of the drainage catheter.  This made the procedure difficult as I had to adjust the position of the catheter frequently, flush and advanced a wire. Recommendations: Keep the pericardial drain in place and monitor output.  The catheter might need to be flushed frequently. Fluid was sent for analysis and cytology.  CT BIOPSY  Result Date: 04/02/2020 INDICATION: Large mediastinal mass concerning for lymphoma, thymoma versus germ-cell tumor EXAM: CT-GUIDED BIOPSY LARGE ANTERIOR MEDIASTINAL MASS MEDICATIONS: 1% lidocaine local ANESTHESIA/SEDATION: 4.0 mg IV Versed; 100 mcg IV Fentanyl Moderate Sedation Time:  10 minutes The patient was continuously monitored during the procedure by the interventional radiology nurse under my direct supervision. PROCEDURE: The procedure, risks, benefits, and alternatives were explained to the patient. Questions regarding the procedure were encouraged and answered. The patient understands and consents to the procedure. Previous imaging reviewed. Patient positioned supine. Noncontrast localization CT performed. The large anterior mediastinal mass was localized and marked for an anterior intercostal approach. Under sterile conditions and local anesthesia, an 17 gauge guide needle was advanced from an anterior approach into the right lateral margin of the lesion. Needle position confirmed with CT. 18 gauge core  biopsies obtained. These were intact and non fragmented. Samples placed in saline. Needle removed. Postprocedure imaging demonstrates no hemorrhage or hematoma. Patient tolerated the procedure well without complication. Vital sign monitoring by nursing staff during the procedure will continue as patient is in the special procedures unit for post procedure observation. FINDINGS: The images document guide needle placement within the large anterior mediastinal mass. Post biopsy images demonstrate no hemorrhage or hematoma. COMPLICATIONS: None immediate. IMPRESSION: Successful CT-guided fine core biopsy of the large mediastinal mass Electronically Signed   By: Jerilynn Mages.  Shick M.D.   On: 04/02/2020 16:06   DG CHEST PORT  1 VIEW  Result Date: 04/03/2020 CLINICAL DATA:  Increasing shortness of breath EXAM: PORTABLE CHEST 1 VIEW COMPARISON:  04/02/2020 FINDINGS: Near complete opacification of the right hemithorax, worsening since prior study. Large right pleural effusion with shift of mediastinal contours to the left. Only a very small amount of right upper lobe is aerated. Perihilar and left lower lobe airspace disease, increased since prior study. IMPRESSION: Worsening right effusion with near complete opacification of the right hemithorax. Worsening left perihilar and lower lobe airspace disease. Electronically Signed   By: Rolm Baptise M.D.   On: 04/03/2020 06:42   ECHOCARDIOGRAM COMPLETE  Result Date: 04/03/2020    ECHOCARDIOGRAM REPORT   Patient Name:   CLAYTEN ALLCOCK Date of Exam: 04/03/2020 Medical Rec #:  [id number]      Height:       74.5 in Accession #:    [id number]     Weight:       173.0 lb Date of Birth:  16-Jul-1995      BSA:          2.053 m Patient Age:    24 years       BP:           114/80 mmHg Patient Gender: M              HR:           112 bpm. Exam Location:  Inpatient Procedure: 2D Echo             STAT ECHO  Dr. Gardiner Rhyme notified at 8:24 am.  Discussed results with Dr Cyndia Skeeters at 9:10AM. Indications:     Pericardial Effusion I31.3  History:        Patient has no prior history of Echocardiogram examinations.  Sonographer:    Mikki Santee RDCS (AE) Referring Phys: [id number] [place]  1. Large pericardial effusion measuring over 4cm adjacent to RV. There is evidence of tamponade physiology with RA inversion and mitral inflow respiratory variation >25%. However no RV diastolic collapse is seen and IVC while dilated does have respiratory variation. Correlate with clinical evidence of tamponade and recommend cardiology evaluation  2. Left ventricular ejection fraction, by estimation, is 60 to 65%. The left ventricle has normal function. The left ventricle has no regional wall motion abnormalities. Left ventricular diastolic parameters are indeterminate.  3. Right ventricular systolic function is mildly reduced. The right ventricular size is normal. There is moderately elevated pulmonary artery systolic pressure. The estimated right ventricular systolic pressure is 69.4 mmHg.  4. The mitral valve is normal in structure. No evidence of mitral valve regurgitation.  5. Tricuspid valve regurgitation is mild to moderate.  6. The aortic valve was not well visualized. Aortic valve regurgitation is not visualized.  7. The inferior vena cava is dilated in size with >50% respiratory variability, suggesting right atrial pressure of 8 mmHg. FINDINGS  Left Ventricle: Left ventricular ejection fraction, by estimation, is 60 to 65%. The left ventricle has normal function. The left ventricle has no regional wall motion abnormalities. The left ventricular internal cavity size was small. There is no left ventricular hypertrophy. Left ventricular diastolic parameters are indeterminate. Right Ventricle: The right ventricular size is normal. Right vetricular wall thickness was not assessed. Right ventricular systolic function is mildly reduced. There is moderately elevated pulmonary artery systolic pressure. The  tricuspid regurgitant velocity is 3.17 m/s, and with an assumed right atrial pressure of 8 mmHg, the estimated right ventricular systolic pressure is 85.4 mmHg.  Left Atrium: Left atrial size was normal in size. Right Atrium: Right atrial size was normal in size. Pericardium: Large pericardial effusion measuring over 4cm adjacent to RV. There is evidence of tamponade physiology with RA inversion and mitral inflow respiratory variation >25%. However no RV diastolic collapse is seen and IVC while dilated does have respiratory variation. Would recommend. A large pericardial effusion is present. Mitral Valve: The mitral valve is normal in structure. No evidence of mitral valve regurgitation. Tricuspid Valve: The tricuspid valve is normal in structure. Tricuspid valve regurgitation is mild to moderate. Aortic Valve: The aortic valve was not well visualized. Aortic valve regurgitation is not visualized. Pulmonic Valve: The pulmonic valve was not well visualized. Pulmonic valve regurgitation is not visualized. Aorta: The aortic root is normal in size and structure. Venous: The inferior vena cava is dilated in size with greater than 50% respiratory variability, suggesting right atrial pressure of 8 mmHg. IAS/Shunts: The interatrial septum was not well visualized.  LEFT VENTRICLE PLAX 2D LVIDd:         3.80 cm LVIDs:         2.50 cm LV PW:         1.00 cm LV IVS:        1.00 cm LVOT diam:     2.20 cm LV SV:         47 LV SV Index:   23 LVOT Area:     3.80 cm  LEFT ATRIUM             Index      RIGHT ATRIUM          Index LA diam:        2.20 cm 1.07 cm/m RA Area:     7.29 cm LA Vol (A2C):   20.0 ml 9.74 ml/m RA Volume:   10.40 ml 5.07 ml/m LA Vol (A4C):   12.0 ml 5.85 ml/m LA Biplane Vol: 15.6 ml 7.60 ml/m  AORTIC VALVE LVOT Vmax:   73.80 cm/s LVOT Vmean:  46.700 cm/s LVOT VTI:    0.123 m  AORTA Ao Root diam: 2.90 cm MITRAL VALVE               TRICUSPID VALVE MV Area (PHT): 5.02 cm    TR Peak grad:   40.2 mmHg MV  Decel Time: 151 msec    TR Vmax:        317.00 cm/s MV E velocity: 76.30 cm/s MV A velocity: 57.40 cm/s  SHUNTS MV E/A ratio:  1.33        Systemic VTI:  0.12 m                            Systemic Diam: 2.20 cm Oswaldo Milian MD Electronically signed by Oswaldo Milian MD Signature Date/Time: 04/03/2020/9:16:32 AM    Final    ECHOCARDIOGRAM LIMITED  Result Date: 04/04/2020    ECHOCARDIOGRAM LIMITED REPORT   Patient Name:   DYMOND SPREEN Date of Exam: 04/03/2020 Medical Rec #:  [id number]      Height:       74.5 in Accession #:    [id number]     Weight:       173.0 lb Date of Birth:  [birth year]      BSA:          2.053 m Patient Age:    24 years       BP:  0/0 mmHg Patient Gender: M              HR:           120 bpm. Exam Location:  Inpatient Procedure: Limited Echo Indications:    I31.3 Pericardial effusion  History:        Patient has prior history of Echocardiogram examinations, most                 recent 04/03/2020.  Sonographer:    Raquel Sarna Senior RDCS Referring Phys: [phone] [place] A ARIDA  Sonographer Comments: Pericardiocentesis IMPRESSIONS  1. Limited echo for guidance for pericardiocentesis in setting of tamponade. Large pericardial effusion measuring 5.2cm adjacent to RV. Final images show improvement with effusion measuring 2.8 cm and no RA/RV collapse. FINDINGS  Pericardium: Limited echo for guidance for pericardiocentesis in setting of tamponade. Large pericardial effusion measuring 5.2cm adjacent to RV. Final images show improvement with effusion measuring 2.8 cm and no RA/RV collapse. Oswaldo Milian MD Electronically signed by Oswaldo Milian MD Signature Date/Time: 04/04/2020/12:30:46 AM    Final     Cardiac Studies  TTE 04/03/2020 1. Limited echo for guidance for pericardiocentesis in setting of  tamponade. Large pericardial effusion measuring 5.2cm adjacent to RV.  Final images show improvement with effusion measuring 2.8 cm and no RA/RV  collapse.   Patient  Profile  25 year old male with history of mediastinal mass who was admitted on 04/01/2020 with shortness of breath and right pleural effusion.  Cardiology consulted for large pericardial effusion with features of cardiac tamponade.   Assessment & Plan   1.  Large pericardial effusion with tamponade features -He has been admitted with large anterior mediastinal mass now with compression of the right lung with right pleural effusion.  Found to have very large pericardial effusion with tamponade features.  He underwent pericardiocentesis on 04/03/2020.  Effusion was reduced from 5.2 cm down to 2.7 cm. -Still with 250+ cc of drain output.  He is having oozing around the incision site.  This was a difficult procedure and the incision site will likely lose.  Would recommend to continue with dressing changes. -drain will stay in for now due to high output from drain. -Overall condition has improved.  Limited echocardiogram today to reevaluate effusion. -I have asked thoracic surgery to see him to discuss pericardial window.  They may want him to be treated for his malignancy to determine if this will improve it first.  However, given the size of the effusion and the fact that this is likely malignant it may be better to pursue a pericardial window. -I would recommend to continue and start steroids as recommended by oncology.  Hopefully he can start treatment soon. -Gram stain negative.  Follow-up pathology on the pericardial fluid.  No evidence of microorganisms.  I suspect this is all just a malignant effusion in the setting of his anterior mediastinal mass which is likely lymphoma per notes from oncology.  For questions or updates, please contact [facility] Please consult [email or web address] for contact info under   Time Spent with Patient: I have spent a total of 25 minutes with patient reviewing hospital notes, telemetry, EKGs, labs and examining the patient as well as establishing an assessment and plan  that was discussed with the patient.  > 50% of time was spent in direct patient care.    Signed, Addison Naegeli. Audie Box, [place]  04/04/2020 8:17 AM
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
## 2020-04-04 NOTE — Progress Notes (Signed)
  Echocardiogram 2D Echocardiogram has been performed.  Michiel Cowboy 04/04/2020, 10:52 AM

## 2020-04-05 ENCOUNTER — Inpatient Hospital Stay: Payer: Self-pay

## 2020-04-05 ENCOUNTER — Inpatient Hospital Stay (HOSPITAL_COMMUNITY)

## 2020-04-05 ENCOUNTER — Other Ambulatory Visit: Payer: Self-pay | Admitting: Hematology

## 2020-04-05 DIAGNOSIS — C8190 Hodgkin lymphoma, unspecified, unspecified site: Secondary | ICD-10-CM | POA: Insufficient documentation

## 2020-04-05 DIAGNOSIS — I313 Pericardial effusion (noninflammatory): Secondary | ICD-10-CM

## 2020-04-05 DIAGNOSIS — C801 Malignant (primary) neoplasm, unspecified: Secondary | ICD-10-CM

## 2020-04-05 DIAGNOSIS — C8172 Other classical Hodgkin lymphoma, intrathoracic lymph nodes: Secondary | ICD-10-CM

## 2020-04-05 DIAGNOSIS — C8198 Hodgkin lymphoma, unspecified, lymph nodes of multiple sites: Secondary | ICD-10-CM

## 2020-04-05 DIAGNOSIS — Z7189 Other specified counseling: Secondary | ICD-10-CM | POA: Diagnosis not present

## 2020-04-05 DIAGNOSIS — I314 Cardiac tamponade: Secondary | ICD-10-CM | POA: Diagnosis not present

## 2020-04-05 DIAGNOSIS — J9859 Other diseases of mediastinum, not elsewhere classified: Secondary | ICD-10-CM | POA: Diagnosis not present

## 2020-04-05 LAB — CBC
HCT: 41.8 % (ref 39.0–52.0)
Hemoglobin: 12.2 g/dL — ABNORMAL LOW (ref 13.0–17.0)
MCH: 21.6 pg — ABNORMAL LOW (ref 26.0–34.0)
MCHC: 29.2 g/dL — ABNORMAL LOW (ref 30.0–36.0)
MCV: 74 fL — ABNORMAL LOW (ref 80.0–100.0)
Platelets: 813 10*3/uL — ABNORMAL HIGH (ref 150–400)
RBC: 5.65 MIL/uL (ref 4.22–5.81)
RDW: 18.2 % — ABNORMAL HIGH (ref 11.5–15.5)
WBC: 32.4 10*3/uL — ABNORMAL HIGH (ref 4.0–10.5)
nRBC: 0 % (ref 0.0–0.2)

## 2020-04-05 LAB — BODY FLUID CULTURE W GRAM STAIN
Culture: NO GROWTH
Gram Stain: NONE SEEN

## 2020-04-05 LAB — MULTIPLE MYELOMA PANEL, SERUM
Albumin SerPl Elph-Mcnc: 2.4 g/dL — ABNORMAL LOW (ref 2.9–4.4)
Albumin/Glob SerPl: 0.7 (ref 0.7–1.7)
Alpha 1: 0.4 g/dL (ref 0.0–0.4)
Alpha2 Glob SerPl Elph-Mcnc: 1 g/dL (ref 0.4–1.0)
B-Globulin SerPl Elph-Mcnc: 0.9 g/dL (ref 0.7–1.3)
Gamma Glob SerPl Elph-Mcnc: 1.4 g/dL (ref 0.4–1.8)
Globulin, Total: 3.7 g/dL (ref 2.2–3.9)
IgA: 317 mg/dL (ref 90–386)
IgG (Immunoglobin G), Serum: 1474 mg/dL (ref 603–1613)
IgM (Immunoglobulin M), Srm: 55 mg/dL (ref 20–172)
Total Protein ELP: 6.1 g/dL (ref 6.0–8.5)

## 2020-04-05 LAB — BASIC METABOLIC PANEL
Anion gap: 10 (ref 5–15)
BUN: 12 mg/dL (ref 6–20)
CO2: 24 mmol/L (ref 22–32)
Calcium: 9 mg/dL (ref 8.9–10.3)
Chloride: 102 mmol/L (ref 98–111)
Creatinine, Ser: 0.61 mg/dL (ref 0.61–1.24)
GFR, Estimated: >60 mL/min (ref >60)
Glucose, Bld: 115 mg/dL — ABNORMAL HIGH (ref 70–99)
Potassium: 4.4 mmol/L (ref 3.5–5.1)
Sodium: 136 mmol/L (ref 135–145)

## 2020-04-05 LAB — SURGICAL PATHOLOGY

## 2020-04-05 MED ORDER — SODIUM CHLORIDE 0.9 % IV SOLN
375.0000 mg/m2 | Freq: Once | INTRAVENOUS | Status: DC
  Filled 2020-04-05 (×2): qty 73

## 2020-04-05 MED ORDER — SODIUM CHLORIDE 0.9 % IV SOLN
150.0000 mg | Freq: Once | INTRAVENOUS | Status: DC
  Filled 2020-04-05: qty 5

## 2020-04-05 MED ORDER — HOT PACK MISC ONCOLOGY
1.0000 | Freq: Once | Status: AC | PRN
  Filled 2020-04-05: qty 1

## 2020-04-05 MED ORDER — COLD PACK MISC ONCOLOGY
1.0000 | Freq: Once | Status: DC | PRN
  Filled 2020-04-05: qty 1

## 2020-04-05 MED ORDER — METHYLPREDNISOLONE SODIUM SUCC 125 MG IJ SOLR
80.0000 mg | Freq: Two times a day (BID) | INTRAMUSCULAR | Status: DC
  Administered 2020-04-06 – 2020-04-08 (×5): 80 mg via INTRAVENOUS
  Filled 2020-04-05 (×5): qty 2

## 2020-04-05 MED ORDER — PALONOSETRON HCL INJECTION 0.25 MG/5ML
0.2500 mg | Freq: Once | INTRAVENOUS | Status: DC
  Filled 2020-04-05: qty 5

## 2020-04-05 MED ORDER — MORPHINE SULFATE (PF) 4 MG/ML IV SOLN
4.0000 mg | INTRAVENOUS | Status: DC | PRN
Start: 2020-04-05 — End: 2020-04-08
  Administered 2020-04-05 – 2020-04-06 (×2): 4 mg via INTRAVENOUS
  Filled 2020-04-05 (×2): qty 1

## 2020-04-05 MED ORDER — SODIUM CHLORIDE 0.9 % IV SOLN: Freq: Once | INTRAVENOUS | Status: DC

## 2020-04-05 MED ORDER — DOXORUBICIN HCL CHEMO IV INJECTION 2 MG/ML
25.0000 mg/m2 | Freq: Once | INTRAVENOUS | Status: DC
  Filled 2020-04-05 (×2): qty 24

## 2020-04-05 MED ORDER — VINBLASTINE SULFATE CHEMO INJECTION 1 MG/ML
6.1500 mg/m2 | Freq: Once | INTRAVENOUS | Status: DC
  Filled 2020-04-05 (×2): qty 12

## 2020-04-05 MED ORDER — PANTOPRAZOLE SODIUM 40 MG PO TBEC
40.0000 mg | DELAYED_RELEASE_TABLET | Freq: Every day | ORAL | Status: DC
  Administered 2020-04-06 – 2020-04-10 (×5): 40 mg via ORAL
  Filled 2020-04-05 (×7): qty 1

## 2020-04-05 MED ORDER — PROCHLORPERAZINE EDISYLATE 10 MG/2ML IJ SOLN
10.0000 mg | Freq: Four times a day (QID) | INTRAMUSCULAR | Status: DC | PRN
  Filled 2020-04-05: qty 2

## 2020-04-05 MED ORDER — SODIUM CHLORIDE 0.9 % IV SOLN
10.0000 mg | Freq: Once | INTRAVENOUS | Status: DC
  Filled 2020-04-05: qty 1

## 2020-04-05 NOTE — Progress Notes (Signed)
START ON PATHWAY REGIMEN - Lymphoma and CLL     A cycle is every 28 days:     Bleomycin      Dacarbazine      Doxorubicin      Vinblastine   **Always confirm dose/schedule in your pharmacy ordering system**  Patient Characteristics: Classical Hodgkin Lymphoma, First Line, Stage I / II, Early Unfavorable with Bulky Mediastinal Disease (10 cm or > 1/3 Diameter of Chest) Disease Type: Not Applicable Disease Type: Not Applicable Disease Type: Classical Hodgkin Lymphoma Line of therapy: First Line First Line, Stage I/II Disease Characteristics: Early Unfavorable with Bulky Mediastinal Disease (10 cm or > 1/3 Diameter of Chest) Intent of Therapy: Curative Intent, Discussed with Patient

## 2020-04-05 NOTE — Progress Notes (Addendum)
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 combined findings assessment and plan.  Discussed with patient new diagnosis of Hodgkins lymphoma Stage I/II with unfavorable risk due to mediastinal mass>10 cms. Discussed plan for 2 cycles of ABVD followed by PET/CT with subsequent treatment based on PET/CT results Will need outpatient PET/CT  C1D1 planning to go with only AVD holding bleomycin pending  improvement in lung function and PFTs.  If lung function poor might need to consider replacing Bleomycin with Brentuximab.  Patient initially agreed to the treatment when discussed with Mikey Bussing and PICC was ordered and AVD chemotherapy was scheduled for 8:30 am 04/06/18.  At this time patient notes that he is feeling overwhelmed and wants to wait on the PICC line and chemotherapy till Monday 2/21.  -I answered all his questions and explained the importance of not delaying chemotherapy given his tenuous cardio-pulmonary condition. He notes he is not refusing treatment but does want to wait till Monday. -All his questions about the condition and treatment were addressed. Low risk of infertility<10%. No option for sperm banking at  this time under custody. -patient given patient education material to read about hodgkins lymphoma and its treatment. -will increase Solumedrol to 80mg  q12hours with GI prophylaxis -will readdress chemotherapy on Monday per patient preference.  Sullivan Lone MD MS

## 2020-04-05 NOTE — Progress Notes (Signed)
Patient ID: Martavius Gravois, male   DOB: 1995-12-22, 25 y.o.   MRN: [id number] [facility] DAILY ICU PROGRESS NOTE                   [address]            [place] [address]          [phone]   2 Days Post-Op Procedure(s) (LRB): PERICARDIOCENTESIS (N/A)  Total Length of Stay:  LOS: 4 days   Subjective: Stable this am, breathing well  Objective: Vital signs in last 24 hours: Temp:  [97.7 F (36.5 C)-98.3 F (36.8 C)] 97.9 F (36.6 C) (02/18 0700) Pulse Rate:  [80-227] 95 (02/18 0900) Cardiac Rhythm: Normal sinus rhythm (02/18 0800) Resp:  [18-34] 24 (02/18 0900) BP: (108-131)/(63-99) 121/75 (02/18 0900) SpO2:  [89 %-100 %] 96 % (02/18 0900) Weight:  [72.4 kg] 72.4 kg (02/18 0455)  Filed Weights   04/01/20 1054 04/05/20 0455  Weight: 78.5 kg 72.4 kg    Weight change:    Hemodynamic parameters for last 24 hours:    Intake/Output from previous day: 02/17 0701 - 02/18 0700 In: 600 [P.O.:600] Out: 1125 [Urine:1025; Drains:100]  Intake/Output this shift: No intake/output data recorded.  Current Meds: Scheduled Meds: . (feeding supplement) PROSource Plus  30 mL Oral Daily  . Chlorhexidine Gluconate Cloth  6 each Topical Daily  . enoxaparin (LOVENOX) injection  40 mg Subcutaneous Q24H  . feeding supplement  1 Container Oral Q24H  . feeding supplement  237 mL Oral Q24H  . methylPREDNISolone (SOLU-MEDROL) injection  40 mg Intravenous Q12H  . multivitamin with minerals  1 tablet Oral Daily  . sertraline  50 mg Oral Daily  . sodium chloride flush  3 mL Intravenous Q12H   Continuous Infusions: PRN Meds:.acetaminophen **OR** acetaminophen, guaiFENesin-dextromethorphan, HYDROcodone-acetaminophen, ipratropium-albuterol, LORazepam, morphine injection, polyethylene glycol  General appearance: alert, cooperative and no distress Neurologic: intact Heart: regular rate and rhythm, S1, S2 normal, no murmur, click, rub or gallop Lungs: diminished breath sounds RLL  and RML  Lab Results: CBC: Recent Labs    04/04/20 0006 04/05/20 0111  WBC 14.6* 32.4*  HGB 11.8* 12.2*  HCT 39.8 41.8  PLT 893* 813*   BMET:  Recent Labs    04/04/20 0006 04/05/20 0111  NA 136 136  K 4.4 4.4  CL 98 102  CO2 30 24  GLUCOSE 113* 115*  BUN 7 12  CREATININE 0.72 0.61  CALCIUM 8.8* 9.0    CMET: Lab Results  Component Value Date   WBC 32.4 (H) 04/05/2020   HGB 12.2 (L) 04/05/2020   HCT 41.8 04/05/2020   PLT 813 (H) 04/05/2020   GLUCOSE 115 (H) 04/05/2020   ALT 38 04/01/2020   AST 25 04/01/2020   NA 136 04/05/2020   K 4.4 04/05/2020   CL 102 04/05/2020   CREATININE 0.61 04/05/2020   BUN 12 04/05/2020   CO2 24 04/05/2020   TSH 3.203 04/01/2020   INR 1.4 (H) 04/01/2020      PT/INR: No results for input(s): LABPROT, INR in the last 72 hours. Radiology: [facility] Chest Port 1 View  Result Date: 04/05/2020 CLINICAL DATA:  Pleural effusion EXAM: PORTABLE CHEST 1 VIEW COMPARISON:  04/03/2020 FINDINGS: Interval decrease in size of right pleural effusion. Large effusion still remains. Large mediastinal mass better seen on prior CT. Multiple metallic density foreign bodies again seen scattered throughout the soft tissues of the shoulders and neck. IMPRESSION: Redemonstration of large right mediastinal  mass. Interval decrease in size of right pleural effusion with large effusion still remaining. Electronically Signed   By: Miachel Roux M.D.   On: 04/05/2020 08:47   ECHOCARDIOGRAM LIMITED  Result Date: 04/04/2020    ECHOCARDIOGRAM LIMITED REPORT   Patient Name:   DEZMON CONOVER Date of Exam: 04/04/2020 Medical Rec #:  [id number]      Height:       74.5 in Accession #:    [id number]     Weight:       173.0 lb Date of Birth:  January 01, 1996      BSA:          2.053 m Patient Age:    24 years       BP:           129/80 mmHg Patient Gender: M              HR:           96 bpm. Exam Location:  Inpatient Procedure: Limited Echo Indications:    Pericardial effusion 423.9 / I31.3   History:        Patient has prior history of Echocardiogram examinations, most                 recent 04/03/2020. Risk Factors:Current Smoker. Tamponade.  Sonographer:    Vickie Epley RDCS Referring Phys: [id number] [place]  Conclusion(s)/Recommendation(s): Limited echocardiogram to assess for pericardial effusion s/p pericardiocentesis and drain placement 04/03/20. Continues to have large pericardial effusion, greatest diameter 2.4 cm adjacent to the RV. This is an improvement from prior. No evidence of tamponade, IVC is small and collapses. There is fibrinous material seen (image 20) but the majority of the pericardial fluid has low echodensity. Buford Dresser MD Electronically signed by Buford Dresser MD Signature Date/Time: 04/04/2020/4:20:52 PM    Final      Assessment/Plan: S/P Procedure(s) (LRB): PERICARDIOCENTESIS (N/A) 100 from pericardial drain- leave in for now Pericardial window via left chest would be ventilation problem with large mass in OR  Waiting for path result     Grace Isaac 04/05/2020 9:26 AM

## 2020-04-05 NOTE — Progress Notes (Signed)
MD notified patient's mother wants an update, patient is okay with her receiving update. MD called mother and gave update.
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
## 2020-04-05 NOTE — Progress Notes (Signed)
Spoke with patient and explained PICC insertion benefits and risk. Patient not wanting to place PICC at this time. States, that he has a lot to think about. Will continue to monitor.

## 2020-04-05 NOTE — Progress Notes (Signed)
Cardiology Progress Note  Patient ID: Thomas Sanders MRN: [id number] DOB: 23-Jun-1995 Date of Encounter: 04/05/2020  Primary Cardiologist: No primary care provider on file.  Subjective   Chief Complaint: Shortness of breath  HPI: Pericardial drain was 125 cc out.  Repeat echocardiogram with 2.5 cm anterior and apical pericardial effusion.  CT surgery did not feel he was a good candidate for pericardial window.  Started on steroids for possible lymphoma.  Awaiting final pathology and treatment plan from oncology.  ROS:  All other ROS reviewed and negative. Pertinent positives noted in the HPI.     Inpatient Medications  Scheduled Meds: . (feeding supplement) PROSource Plus  30 mL Oral Daily  . Chlorhexidine Gluconate Cloth  6 each Topical Daily  . enoxaparin (LOVENOX) injection  40 mg Subcutaneous Q24H  . feeding supplement  1 Container Oral Q24H  . feeding supplement  237 mL Oral Q24H  . methylPREDNISolone (SOLU-MEDROL) injection  40 mg Intravenous Q12H  . multivitamin with minerals  1 tablet Oral Daily  . sertraline  50 mg Oral Daily  . sodium chloride flush  3 mL Intravenous Q12H   Continuous Infusions:  PRN Meds: acetaminophen **OR** acetaminophen, guaiFENesin-dextromethorphan, HYDROcodone-acetaminophen, ipratropium-albuterol, LORazepam, morphine injection, polyethylene glycol   Vital Signs   Vitals:   04/05/20 0500 04/05/20 0600 04/05/20 0700 04/05/20 0800  BP: 108/72 116/78 (!) 122/99 121/76  Pulse: (!) 102 95 (!) 104 (!) 103  Resp: (!) 27 (!) 23 (!) 27 (!) 27  Temp:   97.9 F (36.6 C)   TempSrc:      SpO2: 98% 99% 99% 96%  Weight:      Height:        Intake/Output Summary (Last 24 hours) at 04/05/2020 0833 Last data filed at 04/05/2020 0600 Gross per 24 hour  Intake 600 ml  Output 1125 ml  Net -525 ml   Last 3 Weights 04/05/2020 04/01/2020  Weight (lbs) 159 lb 9.6 oz 173 lb  Weight (kg) 72.394 kg 78.472 kg      Telemetry  Overnight telemetry shows sinus  tachycardia in the low 100s, which I personally reviewed.   Physical Exam   Vitals:   04/05/20 0500 04/05/20 0600 04/05/20 0700 04/05/20 0800  BP: 108/72 116/78 (!) 122/99 121/76  Pulse: (!) 102 95 (!) 104 (!) 103  Resp: (!) 27 (!) 23 (!) 27 (!) 27  Temp:   97.9 F (36.6 C)   TempSrc:      SpO2: 98% 99% 99% 96%  Weight:      Height:         Intake/Output Summary (Last 24 hours) at 04/05/2020 0833 Last data filed at 04/05/2020 0600 Gross per 24 hour  Intake 600 ml  Output 1125 ml  Net -525 ml    Last 3 Weights 04/05/2020 04/01/2020  Weight (lbs) 159 lb 9.6 oz 173 lb  Weight (kg) 72.394 kg 78.472 kg    Body mass index is 20.22 kg/m.   General: Well nourished, well developed, in no acute distress Head: Atraumatic, normal size  Eyes: PEERLA, EOMI  Neck: Supple, no JVD Endocrine: No thryomegaly Cardiac: Normal S1, S2; tachycardia noted, no murmurs rubs or gallops Lungs: Absent breath sounds in the right lung fields Abd: Soft, nontender, no hepatomegaly  Ext: No edema, pulses 2+ Musculoskeletal: No deformities, BUE and BLE strength normal and equal Skin: Warm and dry, no rashes   Neuro: Alert and oriented to person, place, time, and situation, CNII-XII grossly intact, no focal deficits  Psych: Normal mood and affect   Labs  High Sensitivity Troponin:  No results for input(s): TROPONINIHS in the last 720 hours.   Cardiac EnzymesNo results for input(s): TROPONINI in the last 168 hours. No results for input(s): TROPIPOC in the last 168 hours.  Chemistry Recent Labs  Lab 04/01/20 1203 04/02/20 0525 04/03/20 0526 04/04/20 0006 04/05/20 0111  NA 140   < > 137 136 136  K 4.1   < > 4.4 4.4 4.4  CL 102   < > 100 98 102  CO2 27   < > 27 30 24   GLUCOSE 88   < > 100* 113* 115*  BUN 8   < > 9 7 12   CREATININE 0.70   < > 0.70 0.72 0.61  CALCIUM 9.0   < > 8.7* 8.8* 9.0  PROT 7.5  --   --   --   --   ALBUMIN 3.0*  --   --   --   --   AST 25  --   --   --   --   ALT 38  --   --    --   --   ALKPHOS 76  --   --   --   --   BILITOT 0.8  --   --   --   --   GFRNONAA >60   < > >60 >60 >60  ANIONGAP 11   < > 10 8 10    < > = values in this interval not displayed.    Hematology Recent Labs  Lab 04/03/20 0526 04/04/20 0006 04/05/20 0111  WBC 14.2* 14.6* 32.4*  RBC 5.11 5.36 5.65  HGB 11.4* 11.8* 12.2*  HCT 38.0* 39.8 41.8  MCV 74.4* 74.3* 74.0*  MCH 22.3* 22.0* 21.6*  MCHC 30.0 29.6* 29.2*  RDW 17.8* 18.0* 18.2*  PLT 951* 893* 813*   BNP Recent Labs  Lab 04/03/20 1313  BNP 22.4    DDimer No results for input(s): DDIMER in the last 168 hours.   Radiology  CARDIAC CATHETERIZATION  Result Date: 04/03/2020 Successful pericardiocentesis via the right subxiphoid area with drainage of almost 900 mL of serous fluid mixed with white-colored clumps which caused frequent clogging of the drainage catheter.  This made the procedure difficult as I had to adjust the position of the catheter frequently, flush and advanced a wire. Recommendations: Keep the pericardial drain in place and monitor output.  The catheter might need to be flushed frequently. Fluid was sent for analysis and cytology.  ECHOCARDIOGRAM LIMITED  Result Date: 04/04/2020    ECHOCARDIOGRAM LIMITED REPORT   Patient Name:   Thomas Sanders Date of Exam: 04/04/2020 Medical Rec #:  [id number]      Height:       74.5 in Accession #:    [id number]     Weight:       173.0 lb Date of Birth:  [birth year]      BSA:          2.053 m Patient Age:    24 years       BP:           129/80 mmHg Patient Gender: M              HR:           96 bpm. Exam Location:  Inpatient Procedure: Limited Echo Indications:    Pericardial effusion 423.9 / I31.3  History:  Patient has prior history of Echocardiogram examinations, most                 recent 04/03/2020. Risk Factors:Current Smoker. Tamponade.  Sonographer:    Vickie Epley RDCS Referring Phys: [id number] [place]  Conclusion(s)/Recommendation(s): Limited  echocardiogram to assess for pericardial effusion s/p pericardiocentesis and drain placement 04/03/20. Continues to have large pericardial effusion, greatest diameter 2.4 cm adjacent to the RV. This is an improvement from prior. No evidence of tamponade, IVC is small and collapses. There is fibrinous material seen (image 20) but the majority of the pericardial fluid has low echodensity. Buford Dresser MD Electronically signed by Buford Dresser MD Signature Date/Time: 04/04/2020/4:20:52 PM    Final    ECHOCARDIOGRAM LIMITED  Result Date: 04/04/2020    ECHOCARDIOGRAM LIMITED REPORT   Patient Name:   Thomas Sanders Date of Exam: 04/03/2020 Medical Rec #:  [id number]      Height:       74.5 in Accession #:    [id number]     Weight:       173.0 lb Date of Birth:  1995-08-12      BSA:          2.053 m Patient Age:    24 years       BP:           0/0 mmHg Patient Gender: M              HR:           120 bpm. Exam Location:  Inpatient Procedure: Limited Echo Indications:    I31.3 Pericardial effusion  History:        Patient has prior history of Echocardiogram examinations, most                 recent 04/03/2020.  Sonographer:    Raquel Sarna Senior RDCS Referring Phys: [phone] MUHAMMAD A ARIDA  Sonographer Comments: Pericardiocentesis IMPRESSIONS  1. Limited echo for guidance for pericardiocentesis in setting of tamponade. Large pericardial effusion measuring 5.2cm adjacent to RV. Final images show improvement with effusion measuring 2.8 cm and no RA/RV collapse. FINDINGS  Pericardium: Limited echo for guidance for pericardiocentesis in setting of tamponade. Large pericardial effusion measuring 5.2cm adjacent to RV. Final images show improvement with effusion measuring 2.8 cm and no RA/RV collapse. Oswaldo Milian MD Electronically signed by Oswaldo Milian MD Signature Date/Time: 04/04/2020/12:30:46 AM    Final     Cardiac Studies  TTE 04/04/2020 Conclusion(s)/Recommendation(s): Limited echocardiogram to  assess for  pericardial effusion s/p pericardiocentesis and drain placement 04/03/20.  Continues to have large pericardial effusion, greatest diameter 2.4 cm  adjacent to the RV. This is an  improvement from prior. No evidence of tamponade, IVC is small and  collapses. There is fibrinous material seen (image 20) but the majority of  the pericardial fluid has low echodensity.  Patient Profile  25 year old male with history of mediastinal mass who was admitted on 04/01/2020 with shortness of breath and right pleural effusion.  Found to have large anterior mediastinal mass with compression of the right lung with right pleural effusion. Cardiology 04/03/2020 consulted for large pericardial effusion with features of cardiac tamponade.  Assessment & Plan   1.  Large pericardial effusion with tamponade features -Admitted with large anterior mediastinal mass with compression of the right lung.  Found to have a very large pericardial effusion on 04/03/2020 up to 5.2 cm.   -Underwent pericardiocentesis with 900 cc output with drain placement 04/03/2020  -  He is having lots of output that is not documented around the incision site.  This was a difficult pericardiocentesis and I suspect the residual output around the drain is just related from an enlarged incision site.  This is slowing down per nursing.  Hopefully this will continue to improve.  Ultimately will need pericardial window see discussion below. -Overnight still with 125 cc output.  Repeat echo on 04/04/2020 still with large effusion up to 2.5 cm anterior and apical location. -Evaluated by cardiac surgery on 04/04/2020 and due to large anterior mediastinal mass with right lung compression they feel he will not be able to undergo general anesthesia for pericardial window. -Negative cultures from pericardial fluid.  Negative Gram stain. -We are awaiting final pathology on the anterior mediastinal mass that was biopsied this admission.  He was started on  steroids by oncology and hopefully he will start on treatment soon. -Overall, we do not have a great plan for his pericardial drain.  It will need to remain in place as he is still having more than 50 cc of output.  Ideally he should get a pericardial window but I do understand that this is not the best time for that.  Hopefully he can get started on treatment for his anterior mediastinal mass and this will shrink and improve his respiratory status to undergo a pericardial window.  Given that this is likely a malignant effusion I do not think this will improve without a window. -Cardiology will continue to follow along while he is here.  For questions or updates, please contact [facility] Please consult [email or web address] for contact info under   Time Spent with Patient: I have spent a total of 25 minutes with patient reviewing hospital notes, telemetry, EKGs, labs and examining the patient as well as establishing an assessment and plan that was discussed with the patient.  > 50% of time was spent in direct patient care.    Signed, Addison Naegeli. Audie Box, [place]  04/05/2020 8:33 AM

## 2020-04-06 ENCOUNTER — Inpatient Hospital Stay (HOSPITAL_COMMUNITY)

## 2020-04-06 ENCOUNTER — Inpatient Hospital Stay: Payer: Self-pay

## 2020-04-06 DIAGNOSIS — J9859 Other diseases of mediastinum, not elsewhere classified: Secondary | ICD-10-CM | POA: Diagnosis not present

## 2020-04-06 DIAGNOSIS — C801 Malignant (primary) neoplasm, unspecified: Secondary | ICD-10-CM | POA: Diagnosis not present

## 2020-04-06 DIAGNOSIS — I314 Cardiac tamponade: Secondary | ICD-10-CM | POA: Diagnosis not present

## 2020-04-06 DIAGNOSIS — I313 Pericardial effusion (noninflammatory): Secondary | ICD-10-CM | POA: Diagnosis not present

## 2020-04-06 LAB — CBC WITH DIFFERENTIAL/PLATELET
Abs Immature Granulocytes: 0.42 10*3/uL — ABNORMAL HIGH (ref 0.00–0.07)
Basophils Absolute: 0 10*3/uL (ref 0.0–0.1)
Basophils Relative: 0 %
Eosinophils Absolute: 0 10*3/uL (ref 0.0–0.5)
Eosinophils Relative: 0 %
HCT: 38.3 % — ABNORMAL LOW (ref 39.0–52.0)
Hemoglobin: 11.4 g/dL — ABNORMAL LOW (ref 13.0–17.0)
Immature Granulocytes: 1 %
Lymphocytes Relative: 2 %
Lymphs Abs: 0.6 10*3/uL — ABNORMAL LOW (ref 0.7–4.0)
MCH: 21.9 pg — ABNORMAL LOW (ref 26.0–34.0)
MCHC: 29.8 g/dL — ABNORMAL LOW (ref 30.0–36.0)
MCV: 73.5 fL — ABNORMAL LOW (ref 80.0–100.0)
Monocytes Absolute: 2.4 10*3/uL — ABNORMAL HIGH (ref 0.1–1.0)
Monocytes Relative: 8 %
Neutro Abs: 25.7 10*3/uL — ABNORMAL HIGH (ref 1.7–7.7)
Neutrophils Relative %: 89 %
Platelets: 794 10*3/uL — ABNORMAL HIGH (ref 150–400)
RBC: 5.21 MIL/uL (ref 4.22–5.81)
RDW: 17.9 % — ABNORMAL HIGH (ref 11.5–15.5)
WBC: 29.2 10*3/uL — ABNORMAL HIGH (ref 4.0–10.5)
nRBC: 0 % (ref 0.0–0.2)

## 2020-04-06 LAB — COMPREHENSIVE METABOLIC PANEL
ALT: 192 U/L — ABNORMAL HIGH (ref 0–44)
AST: 179 U/L — ABNORMAL HIGH (ref 15–41)
Albumin: 2.3 g/dL — ABNORMAL LOW (ref 3.5–5.0)
Alkaline Phosphatase: 72 U/L (ref 38–126)
Anion gap: 11 (ref 5–15)
BUN: 15 mg/dL (ref 6–20)
CO2: 24 mmol/L (ref 22–32)
Calcium: 8.8 mg/dL — ABNORMAL LOW (ref 8.9–10.3)
Chloride: 104 mmol/L (ref 98–111)
Creatinine, Ser: 0.51 mg/dL — ABNORMAL LOW (ref 0.61–1.24)
GFR, Estimated: >60 mL/min (ref >60)
Glucose, Bld: 101 mg/dL — ABNORMAL HIGH (ref 70–99)
Potassium: 4.5 mmol/L (ref 3.5–5.1)
Sodium: 139 mmol/L (ref 135–145)
Total Bilirubin: 0.4 mg/dL (ref 0.3–1.2)
Total Protein: 6.5 g/dL (ref 6.5–8.1)

## 2020-04-06 MED ORDER — SODIUM CHLORIDE 0.9% FLUSH
10.0000 mL | Freq: Two times a day (BID) | INTRAVENOUS | Status: DC
  Administered 2020-04-06 – 2020-04-10 (×8): 10 mL

## 2020-04-06 MED ORDER — MELATONIN 3 MG PO TABS
3.0000 mg | ORAL_TABLET | Freq: Every day | ORAL | Status: DC
  Administered 2020-04-06 – 2020-04-09 (×4): 3 mg via ORAL
  Filled 2020-04-06 (×4): qty 1

## 2020-04-06 MED ORDER — SODIUM CHLORIDE 0.9% FLUSH: 10.0000 mL | INTRAVENOUS | Status: DC | PRN
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
## 2020-04-06 NOTE — Plan of Care (Signed)
  Problem: Clinical Measurements: Goal: Cardiovascular complication will be avoided Outcome: Progressing   Problem: Activity: Goal: Risk for activity intolerance will decrease Outcome: Progressing   Problem: Nutrition: Goal: Adequate nutrition will be maintained Outcome: Progressing   

## 2020-04-06 NOTE — Progress Notes (Signed)
Peripherally Inserted Central Catheter Placement  The IV Nurse has discussed with the patient and/or persons authorized to consent for the patient, the purpose of this procedure and the potential benefits and risks involved with this procedure.  The benefits include less needle sticks, lab draws from the catheter, and the patient may be discharged home with the catheter. Risks include, but not limited to, infection, bleeding, blood clot (thrombus formation), and puncture of an artery; nerve damage and irregular heartbeat and possibility to perform a PICC exchange if needed/ordered by physician.  Alternatives to this procedure were also discussed.  Bard Power PICC patient education guide, fact sheet on infection prevention and patient information card has been provided to patient /or left at bedside.    PICC Placement Documentation  PICC Double Lumen 04/06/20 PICC Right Brachial 41 cm 0 cm (Active)  Indication for Insertion or Continuance of Line Prolonged intravenous therapies 04/06/20 1300  Exposed Catheter (cm) 0 cm 04/06/20 1300  Site Assessment Clean;Dry;Intact 04/06/20 1300  Lumen #1 Status Flushed;Saline locked;Blood return noted 04/06/20 1300  Lumen #2 Status Flushed;Saline locked;Blood return noted 04/06/20 1300  Dressing Type Transparent;Securing device 04/06/20 1300  Dressing Status Clean;Dry;Intact 04/06/20 1300  Antimicrobial disc in place? Yes 04/06/20 1300  Safety Lock Not Applicable 33/83/29 1916  Line Care Connections checked and tightened 04/06/20 1300  Dressing Intervention New dressing 04/06/20 1300  Dressing Change Due 04/13/20 04/06/20 1300       Holley Bouche Renee 04/06/2020, 1:23 PM

## 2020-04-06 NOTE — Progress Notes (Signed)
Cardiology Progress Note  Patient ID: Thomas Sanders MRN: [id number] DOB: 19-Sep-1995 Date of Encounter: 04/06/2020  Primary Cardiologist: No primary care provider on file.  Subjective   Chief Complaint: Shortness of breath  HPI: Pericardial drain output 50 cc.  Has continued to have additional drainage around drain site.  Surgical pathology consistent with Hodgkin's lymphoma.  PICC line was ordered and plan to start chemotherapy.  However patient refused PICC line and chemo at this time.  Solu-Medrol increased to 80 mg every 12 hours.  He denies any dyspnea.  Reports occasional chest pain.   ROS:  All other ROS reviewed and negative. Pertinent positives noted in the HPI.     Inpatient Medications  Scheduled Meds: . (feeding supplement) PROSource Plus  30 mL Oral Daily  . Chlorhexidine Gluconate Cloth  6 each Topical Daily  . dacarbazine (DTIC) CHEMO IV infusion Peripheral Line  375 mg/m2 (Treatment Plan Recorded) Intravenous Once  . DOXOrubicin  25 mg/m2 (Treatment Plan Recorded) Intravenous Once  . enoxaparin (LOVENOX) injection  40 mg Subcutaneous Q24H  . feeding supplement  1 Container Oral Q24H  . feeding supplement  237 mL Oral Q24H  . melatonin  3 mg Oral QHS  . methylPREDNISolone (SOLU-MEDROL) injection  80 mg Intravenous Q12H  . multivitamin with minerals  1 tablet Oral Daily  . palonosetron  0.25 mg Intravenous Once  . pantoprazole  40 mg Oral Daily  . sertraline  50 mg Oral Daily  . sodium chloride flush  3 mL Intravenous Q12H  . vinBLAStine (VELBAN) CHEMO IV infusion  6.15 mg/m2 (Treatment Plan Recorded) Intravenous Once   Continuous Infusions: . sodium chloride    . dexamethasone (DECADRON) IVPB ([facility]) Stopped (04/06/20 0810)  . fosaprepitant (EMEND) IV infusion 150 mg Stopped (04/06/20 0811)   PRN Meds: acetaminophen **OR** acetaminophen, Cold Pack, Hot Pack, HYDROcodone-acetaminophen, ipratropium-albuterol, LORazepam, morphine injection, polyethylene glycol,  prochlorperazine   Vital Signs   Vitals:   04/06/20 0600 04/06/20 0700 04/06/20 0800 04/06/20 0900  BP:      Pulse: 84 99 (!) 108 93  Resp: 16 (!) 23 (!) 22 (!) 22  Temp:  97.8 F (36.6 C)    TempSrc:   Oral   SpO2: 97% 100% 99% 100%  Weight:      Height:        Intake/Output Summary (Last 24 hours) at 04/06/2020 0949 Last data filed at 04/06/2020 0600 Gross per 24 hour  Intake 360 ml  Output 1250 ml  Net -890 ml   Last 3 Weights 04/05/2020 04/01/2020  Weight (lbs) 159 lb 9.6 oz 173 lb  Weight (kg) 72.394 kg 78.472 kg      Telemetry  Overnight telemetry shows sinus tachycardia in the low 100s, which I personally reviewed.   Physical Exam   Vitals:   04/06/20 0600 04/06/20 0700 04/06/20 0800 04/06/20 0900  BP:      Pulse: 84 99 (!) 108 93  Resp: 16 (!) 23 (!) 22 (!) 22  Temp:  97.8 F (36.6 C)    TempSrc:   Oral   SpO2: 97% 100% 99% 100%  Weight:      Height:         Intake/Output Summary (Last 24 hours) at 04/06/2020 0949 Last data filed at 04/06/2020 0600 Gross per 24 hour  Intake 360 ml  Output 1250 ml  Net -890 ml    Last 3 Weights 04/05/2020 04/01/2020  Weight (lbs) 159 lb 9.6 oz 173 lb  Weight (kg) 72.394  kg 78.472 kg    Body mass index is 20.22 kg/m.   General: Well nourished, well developed, in no acute distress Head: Atraumatic, normal size  Eyes: PEERLA, EOMI  Neck: Supple, no JVD Endocrine: No thryomegaly Cardiac: Normal S1, S2; tachycardia noted, no murmurs rubs or gallops Lungs: Absent breath sounds in the right lung fields Abd: Soft, nontender, no hepatomegaly  Ext: No edema, pulses 2+ Musculoskeletal: No deformities, BUE and BLE strength normal and equal Skin: Warm and dry, no rashes   Neuro: Alert and oriented to person, place, time, and situation, CNII-XII grossly intact, no focal deficits  Psych: Normal mood and affect   Labs  High Sensitivity Troponin:  No results for input(s): TROPONINIHS in the last 720 hours.   Cardiac EnzymesNo  results for input(s): TROPONINI in the last 168 hours. No results for input(s): TROPIPOC in the last 168 hours.  Chemistry Recent Labs  Lab 04/01/20 1203 04/02/20 0525 04/04/20 0006 04/05/20 0111 04/06/20 0108  NA 140   < > 136 136 139  K 4.1   < > 4.4 4.4 4.5  CL 102   < > 98 102 104  CO2 27   < > 30 24 24   GLUCOSE 88   < > 113* 115* 101*  BUN 8   < > 7 12 15   CREATININE 0.70   < > 0.72 0.61 0.51*  CALCIUM 9.0   < > 8.8* 9.0 8.8*  PROT 7.5  --   --   --  6.5  ALBUMIN 3.0*  --   --   --  2.3*  AST 25  --   --   --  179*  ALT 38  --   --   --  192*  ALKPHOS 76  --   --   --  72  BILITOT 0.8  --   --   --  0.4  GFRNONAA >60   < > >60 >60 >60  ANIONGAP 11   < > 8 10 11    < > = values in this interval not displayed.    Hematology Recent Labs  Lab 04/04/20 0006 04/05/20 0111 04/06/20 0108  WBC 14.6* 32.4* 29.2*  RBC 5.36 5.65 5.21  HGB 11.8* 12.2* 11.4*  HCT 39.8 41.8 38.3*  MCV 74.3* 74.0* 73.5*  MCH 22.0* 21.6* 21.9*  MCHC 29.6* 29.2* 29.8*  RDW 18.0* 18.2* 17.9*  PLT 893* 813* 794*   BNP Recent Labs  Lab 04/03/20 1313  BNP 22.4    DDimer No results for input(s): DDIMER in the last 168 hours.   Radiology  DG Chest Port 1 View  Result Date: 04/05/2020 CLINICAL DATA:  Pleural effusion EXAM: PORTABLE CHEST 1 VIEW COMPARISON:  04/03/2020 FINDINGS: Interval decrease in size of right pleural effusion. Large effusion still remains. Large mediastinal mass better seen on prior CT. Multiple metallic density foreign bodies again seen scattered throughout the soft tissues of the shoulders and neck. IMPRESSION: Redemonstration of large right mediastinal mass. Interval decrease in size of right pleural effusion with large effusion still remaining. Electronically Signed   By: Miachel Roux M.D.   On: 04/05/2020 08:47   ECHOCARDIOGRAM LIMITED  Result Date: 04/04/2020    ECHOCARDIOGRAM LIMITED REPORT   Patient Name:   Thomas Sanders Date of Exam: 04/04/2020 Medical Rec #:   [id number]      Height:       74.5 in Accession #:    [id number]     Weight:  173.0 lb Date of Birth:  [birth year]      BSA:          2.053 m Patient Age:    24 years       BP:           129/80 mmHg Patient Gender: M              HR:           96 bpm. Exam Location:  Inpatient Procedure: Limited Echo Indications:    Pericardial effusion 423.9 / I31.3  History:        Patient has prior history of Echocardiogram examinations, most                 recent 04/03/2020. Risk Factors:Current Smoker. Tamponade.  Sonographer:    Vickie Epley RDCS Referring Phys: [id number] [place]  Conclusion(s)/Recommendation(s): Limited echocardiogram to assess for pericardial effusion s/p pericardiocentesis and drain placement 04/03/20. Continues to have large pericardial effusion, greatest diameter 2.4 cm adjacent to the RV. This is an improvement from prior. No evidence of tamponade, IVC is small and collapses. There is fibrinous material seen (image 20) but the majority of the pericardial fluid has low echodensity. Buford Dresser MD Electronically signed by Buford Dresser MD Signature Date/Time: 04/04/2020/4:20:52 PM    Final    [place] EKG SITE RITE  Result Date: 04/05/2020 If Site Rite image not attached, placement could not be confirmed due to current cardiac rhythm.   Cardiac Studies  TTE 04/04/2020 Conclusion(s)/Recommendation(s): Limited echocardiogram to assess for  pericardial effusion s/p pericardiocentesis and drain placement 04/03/20.  Continues to have large pericardial effusion, greatest diameter 2.4 cm  adjacent to the RV. This is an  improvement from prior. No evidence of tamponade, IVC is small and  collapses. There is fibrinous material seen (image 20) but the majority of  the pericardial fluid has low echodensity.  Patient Profile  25 year old male with history of mediastinal mass who was admitted on 04/01/2020 with shortness of breath and right pleural effusion.  Found to have large  anterior mediastinal mass with compression of the right lung with right pleural effusion. Cardiology 04/03/2020 consulted for large pericardial effusion with features of cardiac tamponade.  Assessment & Plan   1.  Large pericardial effusion with tamponade features: Admitted with large anterior mediastinal mass with compression of the right lung.  Found to have a very large pericardial effusion on 04/03/2020 up to 5.2 cm.  Underwent pericardiocentesis with 900 cc output with drain placement 04/03/2020.  Surgical pathology from biopsy consistent with Hodgkin's lymphoma -Drain output down to 50 cc overnight, but has continued to have additional leakage of fluid around drain site, but has improved.  Will plan limited echo tomorrow, potentially remove drain if output continues to decrease.  I do think ultimately though he is going to need a pericardial window -Evaluated by cardiac surgery on 04/04/2020 and due to large anterior mediastinal mass with right lung compression he is high risk to undergo general anesthesia for pericardial window.  Hopefully can get started on treatment for his lymphoma and respiratory status improve to undergo window.  -He spoke with hematology yesterday and recommended PICC line placement and starting chemotherapy.  He declined at that time, wanted to think about it more.  In talking this morning, he is willing to have PICC line placed.  Will plan for PICC line placement today    For questions or updates, please contact [place] Please consult [email or web address] for contact  info under   Donato Heinz, MD

## 2020-04-06 NOTE — Progress Notes (Signed)
Patient ID: Thomas Sanders, male   DOB: 1996-01-08, 25 y.o.   MRN: [id number] [facility] DAILY ICU PROGRESS NOTE                   [address]            [place] [address]          [phone]   3 Days Post-Op Procedure(s) (LRB): PERICARDIOCENTESIS (N/A)  Total Length of Stay:  LOS: 5 days   Subjective: Patient sitting on side of bed, respiratory status is stable, very frustrated about blood drawing-currently refusing to have any further blood drawn  Objective: Vital signs in last 24 hours: Temp:  [97.7 F (36.5 C)-98.4 F (36.9 C)] 97.8 F (36.6 C) (02/19 0700) Pulse Rate:  [59-105] 84 (02/19 0600) Cardiac Rhythm: Normal sinus rhythm (02/18 1600) Resp:  [13-30] 16 (02/19 0600) BP: (101-123)/(62-83) 112/62 (02/19 0400) SpO2:  [94 %-100 %] 97 % (02/19 0600)  Filed Weights   04/01/20 1054 04/05/20 0455  Weight: 78.5 kg 72.4 kg    Weight change:    Hemodynamic parameters for last 24 hours:    Intake/Output from previous day: 02/18 0701 - 02/19 0700 In: 360 [P.O.:360] Out: 1250 [Urine:1200; Drains:50]  Intake/Output this shift: No intake/output data recorded.  Current Meds: Scheduled Meds: . (feeding supplement) PROSource Plus  30 mL Oral Daily  . Chlorhexidine Gluconate Cloth  6 each Topical Daily  . dacarbazine (DTIC) CHEMO IV infusion Peripheral Line  375 mg/m2 (Treatment Plan Recorded) Intravenous Once  . DOXOrubicin  25 mg/m2 (Treatment Plan Recorded) Intravenous Once  . enoxaparin (LOVENOX) injection  40 mg Subcutaneous Q24H  . feeding supplement  1 Container Oral Q24H  . feeding supplement  237 mL Oral Q24H  . melatonin  3 mg Oral QHS  . methylPREDNISolone (SOLU-MEDROL) injection  80 mg Intravenous Q12H  . multivitamin with minerals  1 tablet Oral Daily  . palonosetron  0.25 mg Intravenous Once  . pantoprazole  40 mg Oral Daily  . sertraline  50 mg Oral Daily  . sodium chloride flush  3 mL Intravenous Q12H  . vinBLAStine (VELBAN) CHEMO IV  infusion  6.15 mg/m2 (Treatment Plan Recorded) Intravenous Once   Continuous Infusions: . sodium chloride    . dexamethasone (DECADRON) IVPB ([facility]) Stopped (04/06/20 0810)  . fosaprepitant (EMEND) IV infusion 150 mg Stopped (04/06/20 0811)   PRN Meds:.acetaminophen **OR** acetaminophen, Cold Pack, Hot Pack, HYDROcodone-acetaminophen, ipratropium-albuterol, LORazepam, morphine injection, polyethylene glycol, prochlorperazine  General appearance: alert and appears stated age Neurologic: intact Heart: regular rate and rhythm, S1, S2 normal, no murmur, click, rub or gallop Lungs: diminished breath sounds RLL and RML  Lab Results: CBC: Recent Labs    04/05/20 0111 04/06/20 0108  WBC 32.4* 29.2*  HGB 12.2* 11.4*  HCT 41.8 38.3*  PLT 813* 794*   BMET:  Recent Labs    04/05/20 0111 04/06/20 0108  NA 136 139  K 4.4 4.5  CL 102 104  CO2 24 24  GLUCOSE 115* 101*  BUN 12 15  CREATININE 0.61 0.51*  CALCIUM 9.0 8.8*    CMET: Lab Results  Component Value Date   WBC 29.2 (H) 04/06/2020   HGB 11.4 (L) 04/06/2020   HCT 38.3 (L) 04/06/2020   PLT 794 (H) 04/06/2020   GLUCOSE 101 (H) 04/06/2020   ALT 192 (H) 04/06/2020   AST 179 (H) 04/06/2020   NA 139 04/06/2020   K 4.5 04/06/2020   CL 104 04/06/2020  CREATININE 0.51 (L) 04/06/2020   BUN 15 04/06/2020   CO2 24 04/06/2020   TSH 3.203 04/01/2020   INR 1.4 (H) 04/01/2020      PT/INR: No results for input(s): LABPROT, INR in the last 72 hours. Radiology: [facility] Chest Port 1 View  Result Date: 04/05/2020 CLINICAL DATA:  Pleural effusion EXAM: PORTABLE CHEST 1 VIEW COMPARISON:  04/03/2020 FINDINGS: Interval decrease in size of right pleural effusion. Large effusion still remains. Large mediastinal mass better seen on prior CT. Multiple metallic density foreign bodies again seen scattered throughout the soft tissues of the shoulders and neck. IMPRESSION: Redemonstration of large right mediastinal mass. Interval decrease in size of  right pleural effusion with large effusion still remaining. Electronically Signed   By: Miachel Roux M.D.   On: 04/05/2020 08:47   [place] EKG SITE RITE  Result Date: 04/05/2020 If Site Rite image not attached, placement could not be confirmed due to current cardiac rhythm.  DG Chest Port 1 View  Result Date: 04/05/2020 CLINICAL DATA:  Pleural effusion EXAM: PORTABLE CHEST 1 VIEW COMPARISON:  04/03/2020 FINDINGS: Interval decrease in size of right pleural effusion. Large effusion still remains. Large mediastinal mass better seen on prior CT. Multiple metallic density foreign bodies again seen scattered throughout the soft tissues of the shoulders and neck. IMPRESSION: Redemonstration of large right mediastinal mass. Interval decrease in size of right pleural effusion with large effusion still remaining. Electronically Signed   By: Miachel Roux M.D.   On: 04/05/2020 08:47   ECHOCARDIOGRAM LIMITED  Result Date: 04/04/2020    ECHOCARDIOGRAM LIMITED REPORT   Patient Name:   Thomas Sanders Date of Exam: 04/04/2020 Medical Rec #:  [id number]      Height:       74.5 in Accession #:    [id number]     Weight:       173.0 lb Date of Birth:  17-May-1995      BSA:          2.053 m Patient Age:    24 years       BP:           129/80 mmHg Patient Gender: M              HR:           96 bpm. Exam Location:  Inpatient Procedure: Limited Echo Indications:    Pericardial effusion 423.9 / I31.3  History:        Patient has prior history of Echocardiogram examinations, most                 recent 04/03/2020. Risk Factors:Current Smoker. Tamponade.  Sonographer:    Vickie Epley RDCS Referring Phys: [id number] [place]  Conclusion(s)/Recommendation(s): Limited echocardiogram to assess for pericardial effusion s/p pericardiocentesis and drain placement 04/03/20. Continues to have large pericardial effusion, greatest diameter 2.4 cm adjacent to the RV. This is an improvement from prior. No evidence of tamponade, IVC is small and  collapses. There is fibrinous material seen (image 20) but the majority of the pericardial fluid has low echodensity. Buford Dresser MD Electronically signed by Buford Dresser MD Signature Date/Time: 04/04/2020/4:20:52 PM    Final     Assessment/Plan: S/P Procedure(s) (LRB): PERICARDIOCENTESIS (N/A) Patient does not want blood drawn-PICC line was to be placed yesterday I encouraged the patient strongly to allow the PICC line to be placed both for blood drawing and also drug administration Pericardial drain is now down to 50 mL for  24 hours, likely remove tomorrow    Grace Isaac 04/06/2020 8:18 AM

## 2020-04-07 DIAGNOSIS — I313 Pericardial effusion (noninflammatory): Secondary | ICD-10-CM | POA: Diagnosis not present

## 2020-04-07 DIAGNOSIS — C801 Malignant (primary) neoplasm, unspecified: Secondary | ICD-10-CM | POA: Diagnosis not present

## 2020-04-07 DIAGNOSIS — I314 Cardiac tamponade: Secondary | ICD-10-CM | POA: Diagnosis not present

## 2020-04-07 DIAGNOSIS — J9859 Other diseases of mediastinum, not elsewhere classified: Secondary | ICD-10-CM | POA: Diagnosis not present

## 2020-04-07 LAB — CBC
HCT: 36 % — ABNORMAL LOW (ref 39.0–52.0)
Hemoglobin: 11.3 g/dL — ABNORMAL LOW (ref 13.0–17.0)
MCH: 22.6 pg — ABNORMAL LOW (ref 26.0–34.0)
MCHC: 31.4 g/dL (ref 30.0–36.0)
MCV: 72.1 fL — ABNORMAL LOW (ref 80.0–100.0)
Platelets: 764 10*3/uL — ABNORMAL HIGH (ref 150–400)
RBC: 4.99 MIL/uL (ref 4.22–5.81)
RDW: 18.1 % — ABNORMAL HIGH (ref 11.5–15.5)
WBC: 25.3 10*3/uL — ABNORMAL HIGH (ref 4.0–10.5)
nRBC: 0 % (ref 0.0–0.2)

## 2020-04-07 LAB — COMPREHENSIVE METABOLIC PANEL
ALT: 208 U/L — ABNORMAL HIGH (ref 0–44)
AST: 102 U/L — ABNORMAL HIGH (ref 15–41)
Albumin: 2.4 g/dL — ABNORMAL LOW (ref 3.5–5.0)
Alkaline Phosphatase: 76 U/L (ref 38–126)
Anion gap: 8 (ref 5–15)
BUN: 16 mg/dL (ref 6–20)
CO2: 27 mmol/L (ref 22–32)
Calcium: 9 mg/dL (ref 8.9–10.3)
Chloride: 102 mmol/L (ref 98–111)
Creatinine, Ser: 0.61 mg/dL (ref 0.61–1.24)
GFR, Estimated: >60 mL/min (ref >60)
Glucose, Bld: 156 mg/dL — ABNORMAL HIGH (ref 70–99)
Potassium: 4.5 mmol/L (ref 3.5–5.1)
Sodium: 137 mmol/L (ref 135–145)
Total Bilirubin: 0.6 mg/dL (ref 0.3–1.2)
Total Protein: 6.8 g/dL (ref 6.5–8.1)

## 2020-04-07 MED ORDER — SERTRALINE HCL 100 MG PO TABS
100.0000 mg | ORAL_TABLET | Freq: Every day | ORAL | Status: DC
  Administered 2020-04-07 – 2020-04-10 (×4): 100 mg via ORAL
  Filled 2020-04-07 (×5): qty 1

## 2020-04-07 MED ORDER — HYDROMORPHONE HCL 1 MG/ML IJ SOLN
0.5000 mg | INTRAMUSCULAR | Status: DC | PRN
Start: 2020-04-07 — End: 2020-04-10

## 2020-04-07 MED ORDER — TRAZODONE HCL 50 MG PO TABS
50.0000 mg | ORAL_TABLET | Freq: Every day | ORAL | Status: DC
  Administered 2020-04-07 – 2020-04-09 (×3): 50 mg via ORAL
  Filled 2020-04-07 (×3): qty 1
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
## 2020-04-07 NOTE — Progress Notes (Signed)
   04/07/20 1528  Clinical Encounter Type  Visited With Patient;Family  Visit Type Initial;Social support  Referral From Family  Consult/Referral To Chaplain  Stress Factors  Patient Stress Factors Health changes  Family Stress Factors Lack of knowledge   Chaplain received page that Pt's mom called. Chaplain spoke with Pt's mom, Lonie Peak, who wanted a chaplain to pray with her son. Chaplain stated she would see what she can do. Chaplain spoke with federal agent before entering Pt's room. Pt's family isn't supposed to know Pt is here and he believed they might be fishing for info. He requested Chaplain not confirm or deny Pt's admittance if family calls back. Chaplain visited with Pt. Chaplain engaged in active listening and emotional support. Chaplain remains available.  This note was prepared by Chaplain Resident, Dante Gang, MDiv. Chaplain remains available as needed through the on-call pager: [phone].

## 2020-04-07 NOTE — Progress Notes (Signed)
Cardiology Progress Note  Patient ID: Thomas Sanders MRN: [id number] DOB: 1995-03-04 Date of Encounter: 04/07/2020  Primary Cardiologist: No primary care provider on file.  Subjective   Pericardial drain output 25 cc. PICC line place yesterday.  Reports he is doing well, no dyspnea and chest pain improved.   Inpatient Medications  Scheduled Meds: . (feeding supplement) PROSource Plus  30 mL Oral Daily  . Chlorhexidine Gluconate Cloth  6 each Topical Daily  . dacarbazine (DTIC) CHEMO IV infusion Peripheral Line  375 mg/m2 (Treatment Plan Recorded) Intravenous Once  . DOXOrubicin  25 mg/m2 (Treatment Plan Recorded) Intravenous Once  . enoxaparin (LOVENOX) injection  40 mg Subcutaneous Q24H  . feeding supplement  1 Container Oral Q24H  . feeding supplement  237 mL Oral Q24H  . melatonin  3 mg Oral QHS  . methylPREDNISolone (SOLU-MEDROL) injection  80 mg Intravenous Q12H  . multivitamin with minerals  1 tablet Oral Daily  . palonosetron  0.25 mg Intravenous Once  . pantoprazole  40 mg Oral Daily  . sertraline  100 mg Oral Daily  . sodium chloride flush  10-40 mL Intracatheter Q12H  . sodium chloride flush  3 mL Intravenous Q12H  . traZODone  50 mg Oral QHS  . vinBLAStine (VELBAN) CHEMO IV infusion  6.15 mg/m2 (Treatment Plan Recorded) Intravenous Once   Continuous Infusions: . sodium chloride    . dexamethasone (DECADRON) IVPB ([facility]) Stopped (04/06/20 0810)  . fosaprepitant (EMEND) IV infusion 150 mg Stopped (04/06/20 0811)   PRN Meds: acetaminophen **OR** acetaminophen, Cold Pack, HYDROcodone-acetaminophen, HYDROmorphone (DILAUDID) injection, ipratropium-albuterol, LORazepam, morphine injection, polyethylene glycol, prochlorperazine, sodium chloride flush   Vital Signs   Vitals:   04/06/20 2300 04/07/20 0000 04/07/20 0100 04/07/20 0216  BP:   108/72 111/71  Pulse: (!) 102 93 81 90  Resp: (!) 21 (!) 21 15 20   Temp:   97.8 F (36.6 C) 97.6 F (36.4 C)  TempSrc:   Oral  Oral  SpO2: 98% 97% 100% 100%  Weight:    74.3 kg  Height:        Intake/Output Summary (Last 24 hours) at 04/07/2020 0929 Last data filed at 04/07/2020 0916 Gross per 24 hour  Intake 500 ml  Output 425 ml  Net 75 ml   Last 3 Weights 04/07/2020 04/05/2020 04/01/2020  Weight (lbs) 163 lb 12.8 oz 159 lb 9.6 oz 173 lb  Weight (kg) 74.3 kg 72.394 kg 78.472 kg      Telemetry  Overnight telemetry shows sinus rhythm in the 90s, which I personally reviewed.   Physical Exam   Vitals:   04/06/20 2300 04/07/20 0000 04/07/20 0100 04/07/20 0216  BP:   108/72 111/71  Pulse: (!) 102 93 81 90  Resp: (!) 21 (!) 21 15 20   Temp:   97.8 F (36.6 C) 97.6 F (36.4 C)  TempSrc:   Oral Oral  SpO2: 98% 97% 100% 100%  Weight:    74.3 kg  Height:         Intake/Output Summary (Last 24 hours) at 04/07/2020 0929 Last data filed at 04/07/2020 0916 Gross per 24 hour  Intake 500 ml  Output 425 ml  Net 75 ml    Last 3 Weights 04/07/2020 04/05/2020 04/01/2020  Weight (lbs) 163 lb 12.8 oz 159 lb 9.6 oz 173 lb  Weight (kg) 74.3 kg 72.394 kg 78.472 kg    Body mass index is 20.75 kg/m.   General: Well nourished, well developed, in no acute distress Head: Atraumatic,  normal size  Eyes: PEERLA, EOMI  Neck: Supple, no JVD Endocrine: No thryomegaly Cardiac: Normal S1, S2; tachycardia noted, no murmurs rubs or gallops Lungs: Absent breath sounds in the right lung fields Abd: Soft, nontender, no hepatomegaly  Ext: No edema, pulses 2+ Musculoskeletal: No deformities, BUE and BLE strength normal and equal Skin: Warm and dry, no rashes   Neuro: Alert and oriented to person, place, time, and situation, CNII-XII grossly intact, no focal deficits  Psych: Normal mood and affect   Labs  High Sensitivity Troponin:  No results for input(s): TROPONINIHS in the last 720 hours.   Cardiac EnzymesNo results for input(s): TROPONINI in the last 168 hours. No results for input(s): TROPIPOC in the last 168 hours.   Chemistry Recent Labs  Lab 04/01/20 1203 04/02/20 0525 04/05/20 0111 04/06/20 0108 04/07/20 0145  NA 140   < > 136 139 137  K 4.1   < > 4.4 4.5 4.5  CL 102   < > 102 104 102  CO2 27   < > 24 24 27   GLUCOSE 88   < > 115* 101* 156*  BUN 8   < > 12 15 16   CREATININE 0.70   < > 0.61 0.51* 0.61  CALCIUM 9.0   < > 9.0 8.8* 9.0  PROT 7.5  --   --  6.5 6.8  ALBUMIN 3.0*  --   --  2.3* 2.4*  AST 25  --   --  179* 102*  ALT 38  --   --  192* 208*  ALKPHOS 76  --   --  72 76  BILITOT 0.8  --   --  0.4 0.6  GFRNONAA >60   < > >60 >60 >60  ANIONGAP 11   < > 10 11 8    < > = values in this interval not displayed.    Hematology Recent Labs  Lab 04/05/20 0111 04/06/20 0108 04/07/20 0145  WBC 32.4* 29.2* 25.3*  RBC 5.65 5.21 4.99  HGB 12.2* 11.4* 11.3*  HCT 41.8 38.3* 36.0*  MCV 74.0* 73.5* 72.1*  MCH 21.6* 21.9* 22.6*  MCHC 29.2* 29.8* 31.4  RDW 18.2* 17.9* 18.1*  PLT 813* 794* 764*   BNP Recent Labs  Lab 04/03/20 1313  BNP 22.4    DDimer No results for input(s): DDIMER in the last 168 hours.   Radiology  [place] EKG SITE RITE  Result Date: 04/06/2020 If Site Rite image not attached, placement could not be confirmed due to current cardiac rhythm.  [place] EKG SITE RITE  Result Date: 04/05/2020 If Site Rite image not attached, placement could not be confirmed due to current cardiac rhythm.  US Abdomen Limited RUQ (LIVER/GB)  Result Date: 04/06/2020 CLINICAL DATA:  Elevated liver enzymes EXAM: ULTRASOUND ABDOMEN LIMITED RIGHT UPPER QUADRANT COMPARISON:  None. FINDINGS: Gallbladder: No gallstones or wall thickening visualized. No sonographic Murphy sign noted by sonographer. Common bile duct: Diameter: 1.9 mm Liver: The left lobe is not well visualized. Visualized liver is normal. Portal vein is patent on color Doppler imaging with normal direction of blood flow towards the liver. Other: Moderate right pleural effusion. IMPRESSION: 1. Poor visualization of the left hepatic lobe. 2.  Moderate right pleural effusion. Electronically Signed   By: Dorise Bullion III M.D   On: 04/06/2020 14:38    Cardiac Studies  TTE 04/04/2020 Conclusion(s)/Recommendation(s): Limited echocardiogram to assess for  pericardial effusion s/p pericardiocentesis and drain placement 04/03/20.  Continues to have large pericardial effusion, greatest  diameter 2.4 cm  adjacent to the RV. This is an  improvement from prior. No evidence of tamponade, IVC is small and  collapses. There is fibrinous material seen (image 20) but the majority of  the pericardial fluid has low echodensity.  Patient Profile  25 year old male with history of mediastinal mass who was admitted on 04/01/2020 with shortness of breath and right pleural effusion.  Found to have large anterior mediastinal mass with compression of the right lung with right pleural effusion. Cardiology 04/03/2020 consulted for large pericardial effusion with features of cardiac tamponade.  Assessment & Plan   Large pericardial effusion with tamponade features: Admitted with large anterior mediastinal mass with compression of the right lung.  Found to have a very large pericardial effusion on 04/03/2020 up to 5.2 cm.  Underwent pericardiocentesis with 900 cc output with drain placement 04/03/2020.  Surgical pathology from biopsy consistent with Hodgkin's lymphoma -Drain output down to 25cc yesterday.  D/w Dr Servando Snare, will remove drain today.  Plan repeat echo in 48 hours to monitor for reaccumulation.  Given malignant effusion, suspect he will end up needing a window -Evaluated by cardiac surgery on 04/04/2020 and due to large anterior mediastinal mass with right lung compression he is high risk to undergo general anesthesia for pericardial window.  Hopefully can get started on treatment for his lymphoma and respiratory status improves to undergo window.    For questions or updates, please contact [place] Please consult [email or web address] for contact info  under   Donato Heinz, MD

## 2020-04-07 NOTE — Progress Notes (Signed)
Patient ID: Thomas Sanders, male   DOB: Dec 09, 1995, 25 y.o.   MRN: [id number] Patient ID: Thomas Sanders, male   DOB: 01/08/1996, 25 y.o.   MRN: [id number] TCTS DAILY ICU PROGRESS NOTE                   [address]            [place] [address]          [phone]   4 Days Post-Op Procedure(s) (LRB): PERICARDIOCENTESIS (N/A)  Total Length of Stay:  LOS: 6 days   Subjective: patient now on 2c, comfortable   Objective: Vital signs in last 24 hours: Temp:  [97.6 F (36.4 C)-98.3 F (36.8 C)] 97.6 F (36.4 C) (02/20 0216) Pulse Rate:  [35-104] 90 (02/20 0216) Cardiac Rhythm: Sinus tachycardia (02/20 0700) Resp:  [15-26] 20 (02/20 0216) BP: (108-124)/(71-72) 111/71 (02/20 0216) SpO2:  [94 %-100 %] 100 % (02/20 0216) Weight:  [74.3 kg] 74.3 kg (02/20 0216)  Filed Weights   04/01/20 1054 04/05/20 0455 04/07/20 0216  Weight: 78.5 kg 72.4 kg 74.3 kg    Weight change:    Hemodynamic parameters for last 24 hours:    Intake/Output from previous day: 02/19 0701 - 02/20 0700 In: 780 [P.O.:780] Out: 425 [Urine:400; Drains:25]  Intake/Output this shift: Total I/O In: 20 [I.V.:20] Out: -   Current Meds: Scheduled Meds: . (feeding supplement) PROSource Plus  30 mL Oral Daily  . Chlorhexidine Gluconate Cloth  6 each Topical Daily  . dacarbazine (DTIC) CHEMO IV infusion Peripheral Line  375 mg/m2 (Treatment Plan Recorded) Intravenous Once  . DOXOrubicin  25 mg/m2 (Treatment Plan Recorded) Intravenous Once  . enoxaparin (LOVENOX) injection  40 mg Subcutaneous Q24H  . feeding supplement  1 Container Oral Q24H  . feeding supplement  237 mL Oral Q24H  . melatonin  3 mg Oral QHS  . methylPREDNISolone (SOLU-MEDROL) injection  80 mg Intravenous Q12H  . multivitamin with minerals  1 tablet Oral Daily  . palonosetron  0.25 mg Intravenous Once  . pantoprazole  40 mg Oral Daily  . sertraline  100 mg Oral Daily  . sodium chloride flush  10-40 mL Intracatheter Q12H  .  sodium chloride flush  3 mL Intravenous Q12H  . traZODone  50 mg Oral QHS  . vinBLAStine (VELBAN) CHEMO IV infusion  6.15 mg/m2 (Treatment Plan Recorded) Intravenous Once   Continuous Infusions: . sodium chloride    . dexamethasone (DECADRON) IVPB ([facility]) Stopped (04/06/20 0810)  . fosaprepitant (EMEND) IV infusion 150 mg Stopped (04/06/20 0811)   PRN Meds:.acetaminophen **OR** acetaminophen, Cold Pack, HYDROcodone-acetaminophen, HYDROmorphone (DILAUDID) injection, ipratropium-albuterol, LORazepam, morphine injection, polyethylene glycol, prochlorperazine, sodium chloride flush  General appearance: alert and appears stated age Neurologic: intact Heart: regular rate and rhythm, S1, S2 normal, no murmur, click, rub or gallop Lungs: diminished breath sounds RLL and RML  Lab Results: CBC: Recent Labs    04/06/20 0108 04/07/20 0145  WBC 29.2* 25.3*  HGB 11.4* 11.3*  HCT 38.3* 36.0*  PLT 794* 764*   BMET:  Recent Labs    04/06/20 0108 04/07/20 0145  NA 139 137  K 4.5 4.5  CL 104 102  CO2 24 27  GLUCOSE 101* 156*  BUN 15 16  CREATININE 0.51* 0.61  CALCIUM 8.8* 9.0    CMET: Lab Results  Component Value Date   WBC 25.3 (H) 04/07/2020   HGB 11.3 (L) 04/07/2020   HCT 36.0 (L) 04/07/2020   PLT  764 (H) 04/07/2020   GLUCOSE 156 (H) 04/07/2020   ALT 208 (H) 04/07/2020   AST 102 (H) 04/07/2020   NA 137 04/07/2020   K 4.5 04/07/2020   CL 102 04/07/2020   CREATININE 0.61 04/07/2020   BUN 16 04/07/2020   CO2 27 04/07/2020   TSH 3.203 04/01/2020   INR 1.4 (H) 04/01/2020      PT/INR: No results for input(s): LABPROT, INR in the last 72 hours. Radiology: [place] EKG SITE RITE  Result Date: 04/06/2020 If Site Rite image not attached, placement could not be confirmed due to current cardiac rhythm.  US Abdomen Limited RUQ (LIVER/GB)  Result Date: 04/06/2020 CLINICAL DATA:  Elevated liver enzymes EXAM: ULTRASOUND ABDOMEN LIMITED RIGHT UPPER QUADRANT COMPARISON:  None. FINDINGS:  Gallbladder: No gallstones or wall thickening visualized. No sonographic Murphy sign noted by sonographer. Common bile duct: Diameter: 1.9 mm Liver: The left lobe is not well visualized. Visualized liver is normal. Portal vein is patent on color Doppler imaging with normal direction of blood flow towards the liver. Other: Moderate right pleural effusion. IMPRESSION: 1. Poor visualization of the left hepatic lobe. 2. Moderate right pleural effusion. Electronically Signed   By: Dorise Bullion III M.D   On: 04/06/2020 14:38   DG Chest Port 1 View  Result Date: 04/05/2020 CLINICAL DATA:  Pleural effusion EXAM: PORTABLE CHEST 1 VIEW COMPARISON:  04/03/2020 FINDINGS: Interval decrease in size of right pleural effusion. Large effusion still remains. Large mediastinal mass better seen on prior CT. Multiple metallic density foreign bodies again seen scattered throughout the soft tissues of the shoulders and neck. IMPRESSION: Redemonstration of large right mediastinal mass. Interval decrease in size of right pleural effusion with large effusion still remaining. Electronically Signed   By: Miachel Roux M.D.   On: 04/05/2020 08:47   ECHOCARDIOGRAM LIMITED  Result Date: 04/04/2020    ECHOCARDIOGRAM LIMITED REPORT   Patient Name:   Thomas Sanders Date of Exam: 04/04/2020 Medical Rec #:  [id number]      Height:       74.5 in Accession #:    [id number]     Weight:       173.0 lb Date of Birth:  [birth year]      BSA:          2.053 m Patient Age:    24 years       BP:           129/80 mmHg Patient Gender: M              HR:           96 bpm. Exam Location:  Inpatient Procedure: Limited Echo Indications:    Pericardial effusion 423.9 / I31.3  History:        Patient has prior history of Echocardiogram examinations, most                 recent 04/03/2020. Risk Factors:Current Smoker. Tamponade.  Sonographer:    Vickie Epley RDCS Referring Phys: [id number] [place]  Conclusion(s)/Recommendation(s): Limited echocardiogram  to assess for pericardial effusion s/p pericardiocentesis and drain placement 04/03/20. Continues to have large pericardial effusion, greatest diameter 2.4 cm adjacent to the RV. This is an improvement from prior. No evidence of tamponade, IVC is small and collapses. There is fibrinous material seen (image 20) but the majority of the pericardial fluid has low echodensity. Buford Dresser MD Electronically signed by Buford Dresser MD Signature Date/Time: 04/04/2020/4:20:52 PM    Final  Assessment/Plan: S/P Procedure(s) (LRB): PERICARDIOCENTESIS (N/A) Patient agreed and pic line placed  Pericardial drain is now down to 50 mL for 24 hours, discussed with cardiology will remove today Follow up echo 2-3 days    Grace Isaac 04/07/2020 10:14 AM

## 2020-04-07 NOTE — Plan of Care (Signed)

## 2020-04-08 DIAGNOSIS — I314 Cardiac tamponade: Secondary | ICD-10-CM | POA: Diagnosis not present

## 2020-04-08 DIAGNOSIS — C8172 Other classical Hodgkin lymphoma, intrathoracic lymph nodes: Secondary | ICD-10-CM

## 2020-04-08 DIAGNOSIS — C801 Malignant (primary) neoplasm, unspecified: Secondary | ICD-10-CM | POA: Diagnosis not present

## 2020-04-08 DIAGNOSIS — I313 Pericardial effusion (noninflammatory): Secondary | ICD-10-CM | POA: Diagnosis not present

## 2020-04-08 DIAGNOSIS — J9859 Other diseases of mediastinum, not elsewhere classified: Secondary | ICD-10-CM | POA: Diagnosis not present

## 2020-04-08 DIAGNOSIS — Z5111 Encounter for antineoplastic chemotherapy: Secondary | ICD-10-CM

## 2020-04-08 LAB — CBC
HCT: 37.3 % — ABNORMAL LOW (ref 39.0–52.0)
Hemoglobin: 11.1 g/dL — ABNORMAL LOW (ref 13.0–17.0)
MCH: 22 pg — ABNORMAL LOW (ref 26.0–34.0)
MCHC: 29.8 g/dL — ABNORMAL LOW (ref 30.0–36.0)
MCV: 73.9 fL — ABNORMAL LOW (ref 80.0–100.0)
Platelets: 670 10*3/uL — ABNORMAL HIGH (ref 150–400)
RBC: 5.05 MIL/uL (ref 4.22–5.81)
RDW: 18.4 % — ABNORMAL HIGH (ref 11.5–15.5)
WBC: 25.3 10*3/uL — ABNORMAL HIGH (ref 4.0–10.5)
nRBC: 0 % (ref 0.0–0.2)

## 2020-04-08 LAB — COMPREHENSIVE METABOLIC PANEL
ALT: 224 U/L — ABNORMAL HIGH (ref 0–44)
AST: 87 U/L — ABNORMAL HIGH (ref 15–41)
Albumin: 2.2 g/dL — ABNORMAL LOW (ref 3.5–5.0)
Alkaline Phosphatase: 68 U/L (ref 38–126)
Anion gap: 9 (ref 5–15)
BUN: 18 mg/dL (ref 6–20)
CO2: 27 mmol/L (ref 22–32)
Calcium: 8.9 mg/dL (ref 8.9–10.3)
Chloride: 102 mmol/L (ref 98–111)
Creatinine, Ser: 0.76 mg/dL (ref 0.61–1.24)
GFR, Estimated: >60 mL/min (ref >60)
Glucose, Bld: 100 mg/dL — ABNORMAL HIGH (ref 70–99)
Potassium: 4.3 mmol/L (ref 3.5–5.1)
Sodium: 138 mmol/L (ref 135–145)
Total Bilirubin: 0.4 mg/dL (ref 0.3–1.2)
Total Protein: 6 g/dL — ABNORMAL LOW (ref 6.5–8.1)

## 2020-04-08 LAB — CULTURE, BODY FLUID W GRAM STAIN -BOTTLE: Culture: NO GROWTH

## 2020-04-08 LAB — CYTOLOGY - NON PAP

## 2020-04-08 LAB — HEPATITIS PANEL, ACUTE
HCV Ab: NONREACTIVE
Hep A IgM: REACTIVE — AB
Hep B C IgM: NONREACTIVE
Hepatitis B Surface Ag: NONREACTIVE

## 2020-04-08 MED ORDER — METHYLPREDNISOLONE SODIUM SUCC 40 MG IJ SOLR
40.0000 mg | Freq: Two times a day (BID) | INTRAMUSCULAR | Status: DC
  Administered 2020-04-09 – 2020-04-10 (×3): 40 mg via INTRAVENOUS
  Filled 2020-04-08 (×5): qty 1

## 2020-04-08 MED ORDER — SODIUM CHLORIDE 0.9% FLUSH: 10.0000 mL | INTRAVENOUS | Status: DC | PRN

## 2020-04-08 MED ORDER — DACARBAZINE 200 MG IV SOLR
375.0000 mg/m2 | Freq: Once | INTRAVENOUS | Status: AC
  Administered 2020-04-08: 730 mg via INTRAVENOUS
  Filled 2020-04-08 (×2): qty 73

## 2020-04-08 MED ORDER — DOXORUBICIN HCL CHEMO IV INJECTION 2 MG/ML
25.0000 mg/m2 | Freq: Once | INTRAVENOUS | Status: AC
  Administered 2020-04-08: 48 mg via INTRAVENOUS
  Filled 2020-04-08 (×2): qty 24

## 2020-04-08 MED ORDER — SODIUM CHLORIDE 0.9 % IV SOLN
150.0000 mg | Freq: Once | INTRAVENOUS | Status: AC
  Administered 2020-04-08: 150 mg via INTRAVENOUS
  Filled 2020-04-08: qty 150

## 2020-04-08 MED ORDER — DEXAMETHASONE SODIUM PHOSPHATE 100 MG/10ML IJ SOLN
10.0000 mg | Freq: Once | INTRAMUSCULAR | Status: AC
  Administered 2020-04-08: 10 mg via INTRAVENOUS
  Filled 2020-04-08: qty 1

## 2020-04-08 MED ORDER — HOT PACK MISC ONCOLOGY
1.0000 | Freq: Once | Status: AC | PRN
  Filled 2020-04-08: qty 1

## 2020-04-08 MED ORDER — VINBLASTINE SULFATE CHEMO INJECTION 1 MG/ML
3.1000 mg/m2 | Freq: Once | INTRAVENOUS | Status: AC
  Administered 2020-04-08: 6 mg via INTRAVENOUS
  Filled 2020-04-08: qty 6

## 2020-04-08 MED ORDER — PALONOSETRON HCL INJECTION 0.25 MG/5ML
0.2500 mg | Freq: Once | INTRAVENOUS | Status: AC
  Administered 2020-04-08: 0.25 mg via INTRAVENOUS
  Filled 2020-04-08 (×2): qty 5

## 2020-04-08 MED ORDER — COLD PACK MISC ONCOLOGY
1.0000 | Freq: Once | Status: DC | PRN
  Filled 2020-04-08: qty 1

## 2020-04-08 MED ORDER — LORAZEPAM 2 MG/ML IJ SOLN: 1.0000 mg | Freq: Once | INTRAMUSCULAR | Status: DC
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
## 2020-04-08 NOTE — Progress Notes (Signed)
Oncology short note  With verbal consent from the patient and from the bedside [employer] I called the patient's mother Mrs. Clegg at [phone].  I provided her with the patient's medical update including his diagnosis of Hodgkin's lymphoma treatment plan and addressed all the questions around his treatment and current medical condition. All her questions were answered to her apparent satisfaction.  She is thankful for his medical cares.  Patient has had significant anxiety and has had pressured speech today suggesting some degree of mania. Patient's mother notes he has had a history of bipolar disorder and oppositional defiant disorder and was under the care of psychiatry prior to being under federal custody.  He is currently only on Zoloft.  In the context of his significant medical stressors anxiety and mania would recommend psychiatry consultation tommorrow.  -ordered lorazepam 1mg  x 1 -recommend hospital medicine consider psychiatry consultation tomorrow for management of mania probably as part of his bipolar disorder -Hep A IgM positive-- ? True Hep A infection vs false +ve ab in the setting of Lymphoma -- recommend ID consultation. -appreciate excellent hospital medicine cares. -spiritual care consultation  Sullivan Lone

## 2020-04-08 NOTE — Progress Notes (Signed)
Ok to treat with elevated LFT's today. Vinblastine dose has been reduced by 50%. Ok to treat with slightly elevated HR per MD.  Acquanetta Belling, [facility], BCPS, BCOP 04/08/2020 9:56 AM

## 2020-04-08 NOTE — Progress Notes (Signed)
Arrived to patient's room to provide ordered chemotherapy. Explained process to patient and allowed time for questions. All questions answered. Labs and vitals reviewed. Noted elevated LFTs. Note from pharmacy indicates an "ok to treat" from Dr. Irene Limbo. Medications given as documented in [facility]. Patient tolerated well with no adverse effects. Medications and potential side effects reviewed with patient. Patient's primary RN will be notified of necessary chemotherapy precautions.

## 2020-04-08 NOTE — Progress Notes (Signed)
      [address]       [place] [address]             [phone]      5 Days Post-Op Procedure(s) (LRB): PERICARDIOCENTESIS (N/A) Subjective: Feels good this morning, sitting up eating breakfast. Patient is concerned about his swelling in his feet and ankles.   Objective: Vital signs in last 24 hours: Temp:  [97.6 F (36.4 C)-99.1 F (37.3 C)] 98 F (36.7 C) (02/20 1950) Pulse Rate:  [89-100] 89 (02/20 1950) Cardiac Rhythm: Normal sinus rhythm (02/20 2334) Resp:  [18-20] 19 (02/20 1950) BP: (106-124)/(73-80) 116/78 (02/20 1950) SpO2:  [97 %-99 %] 97 % (02/20 1950)     Intake/Output from previous day: 02/20 0701 - 02/21 0700 In: 610 [P.O.:590; I.V.:20] Out: 0  Intake/Output this shift: No intake/output data recorded.  General appearance: alert, cooperative and no distress Heart: regular rate and rhythm, S1, S2 normal, no murmur, click, rub or gallop Lungs: clear to auscultation bilaterally Abdomen: soft, non-tender; bowel sounds normal; no masses,  no organomegaly Extremities: 1-2+ non pitting edema Wound: clean and dry  Lab Results: Recent Labs    04/07/20 0145 04/08/20 0528  WBC 25.3* 25.3*  HGB 11.3* 11.1*  HCT 36.0* 37.3*  PLT 764* 670*   BMET:  Recent Labs    04/07/20 0145 04/08/20 0528  NA 137 138  K 4.5 4.3  CL 102 102  CO2 27 27  GLUCOSE 156* 100*  BUN 16 18  CREATININE 0.61 0.76  CALCIUM 9.0 8.9    PT/INR: No results for input(s): LABPROT, INR in the last 72 hours. ABG    Component Value Date/Time   PHART 7.359 04/03/2020 0627   HCO3 26.2 04/03/2020 0627   O2SAT 94.9 04/03/2020 0627   CBG (last 3)  No results for input(s): GLUCAP in the last 72 hours.  Assessment/Plan: S/P Procedure(s) (LRB): PERICARDIOCENTESIS (N/A)  1. CV- NSR in the 80s, BP well controlled 2. Pulm-tolerating room air with good oxygen saturation 3. Renal- creatinine 0.76, electrolytes okay 4. H and H 11.1/37.3, expected acute blood loss  anemia 5. Last echocardiogram 2/17, will need another one in a few days s/p pericardial drain removal. 6. Continue lovenox for DVT prophylaxis 7. Lower ext edema-per medicine.  Plan: s/p pericardiocentesis with pericardial drain placed, now removed. Incision is well healing this morning. Patient's appetite is returning. He will need an echocardiogram in 1-2 days to evaluate pericardial effusion. 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     LOS: 7 days    Elgie Collard 04/08/2020

## 2020-04-08 NOTE — Progress Notes (Addendum)
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 with subsequent treatment based on PET/CT results. Will need outpatient PET/CT. C1D1 planning to go with only AVD holding bleomycin pending improvement in lung function and PFTs.  If lung function poor might need to consider replacing Bleomycin with Brentuximab. -Treatment was again discussed with the patient today as well as side effects including but  not limited to alopecia, myelosuppression with possible need for blood products, nausea, vomiting, peripheral neuropathy, renal and liver dysfunction.  The patient had opportunity ask questions and agrees to proceed. -As needed Compazine is available to him as an antiemetic. -LVEF 60 to 65% on echocardiogram from 04/03/2020.   LOS: 7 days   Mikey Bussing, DNP, AGPCNP-BC, AOCNP 04/08/20   ADDENDUM  .Patient was Personally and independently interviewed, examined and relevant elements of the history of present illness were reviewed in details and an assessment and plan was created. All elements of the patient's history of present illness , assessment and plan were discussed in details with Mikey Bussing, DNP, AGPCNP-BC, AOCNP. The above documentation reflects our combined findings  assessment and plan.  Since her last discussion the patient agreed to PICC line placement for chemotherapy. He received cycle 1 day 1 of AVD chemotherapy for Hodgkin's lymphoma.  Vinblastine was dose reduced by 50% in light of abnormal liver function tests.  Normal liver function tests are likely related to hepatic congestion versus ischemic hepatopathy. 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  We will plan to wean off steroids as patient has received his first cycle of chemotherapy now and steroids are no longer needed for controlling his lymphoma. Will reduce to solumedrol 40mg  q12h tomorrow then 40mg  IV daily and then taper off over 3-5 days.  C1D15 chemotherapy in 2 weeks.  Will need to get PFTs prior to that.  Patient's discharge plan is complicated by his situation being under federal custody.  He will need to be medically stable from a cardiovascular standpoint to be able to go back to a police custody situation.  Sullivan Lone MD MS

## 2020-04-08 NOTE — Progress Notes (Signed)
Cardiology Progress Note  Patient ID: Yoseph Haile MRN: [id number] DOB: 21-Jun-1995 Date of Encounter: 04/08/2020  Primary Cardiologist: No primary care provider on file.  Subjective   Denies dyspnea.  Reports occasional chest pains.   Inpatient Medications  Scheduled Meds: . (feeding supplement) PROSource Plus  30 mL Oral Daily  . Chlorhexidine Gluconate Cloth  6 each Topical Daily  . dacarbazine (DTIC) CHEMO IV infusion Peripheral Line  375 mg/m2 (Treatment Plan Recorded) Intravenous Once  . DOXOrubicin  25 mg/m2 (Treatment Plan Recorded) Intravenous Once  . enoxaparin (LOVENOX) injection  40 mg Subcutaneous Q24H  . feeding supplement  1 Container Oral Q24H  . feeding supplement  237 mL Oral Q24H  . melatonin  3 mg Oral QHS  . methylPREDNISolone (SOLU-MEDROL) injection  80 mg Intravenous Q12H  . multivitamin with minerals  1 tablet Oral Daily  . palonosetron  0.25 mg Intravenous Once  . pantoprazole  40 mg Oral Daily  . sertraline  100 mg Oral Daily  . sodium chloride flush  10-40 mL Intracatheter Q12H  . sodium chloride flush  3 mL Intravenous Q12H  . traZODone  50 mg Oral QHS  . vinBLAStine (VELBAN) CHEMO IV infusion  3.1 mg/m2 (Treatment Plan Recorded) Intravenous Once   Continuous Infusions: . dexamethasone (DECADRON) IVPB ([facility])    . fosaprepitant (EMEND) IV infusion 150 mg     PRN Meds: acetaminophen **OR** acetaminophen, Cold Pack, Hot Pack, HYDROcodone-acetaminophen, HYDROmorphone (DILAUDID) injection, ipratropium-albuterol, LORazepam, morphine injection, polyethylene glycol, prochlorperazine, sodium chloride flush, sodium chloride flush   Vital Signs   Vitals:   04/07/20 1134 04/07/20 1652 04/07/20 1950 04/08/20 0743  BP: 124/80 120/73 116/78 123/79  Pulse: 93 100 89 (!) 102  Resp: 18 20 19  (!) 21  Temp: 98.2 F (36.8 C) 99.1 F (37.3 C) 98 F (36.7 C) 97.9 F (36.6 C)  TempSrc: Oral Oral Oral Oral  SpO2: 97% 97% 97%   Weight:      Height:         Intake/Output Summary (Last 24 hours) at 04/08/2020 1031 Last data filed at 04/07/2020 1700 Gross per 24 hour  Intake 470 ml  Output --  Net 470 ml   Last 3 Weights 04/07/2020 04/05/2020 04/01/2020  Weight (lbs) 163 lb 12.8 oz 159 lb 9.6 oz 173 lb  Weight (kg) 74.3 kg 72.394 kg 78.472 kg      Telemetry  Overnight telemetry shows sinus rhythm in the 80, brief sinus tachycardia up to 130s this morning, which I personally reviewed.   Physical Exam   Vitals:   04/07/20 1134 04/07/20 1652 04/07/20 1950 04/08/20 0743  BP: 124/80 120/73 116/78 123/79  Pulse: 93 100 89 (!) 102  Resp: 18 20 19  (!) 21  Temp: 98.2 F (36.8 C) 99.1 F (37.3 C) 98 F (36.7 C) 97.9 F (36.6 C)  TempSrc: Oral Oral Oral Oral  SpO2: 97% 97% 97%   Weight:      Height:         Intake/Output Summary (Last 24 hours) at 04/08/2020 1031 Last data filed at 04/07/2020 1700 Gross per 24 hour  Intake 470 ml  Output --  Net 470 ml    Last 3 Weights 04/07/2020 04/05/2020 04/01/2020  Weight (lbs) 163 lb 12.8 oz 159 lb 9.6 oz 173 lb  Weight (kg) 74.3 kg 72.394 kg 78.472 kg    Body mass index is 20.75 kg/m.   General: Well nourished, well developed, in no acute distress Head: Atraumatic, normal size  Eyes: PEERLA, EOMI  Neck: Supple, no JVD Cardiac: Normal S1, S2; RRR, no murmurs Lungs: Absent breath sounds in the right lung fields Abd: Soft, nontender, no hepatomegaly  Ext: Trace edema Musculoskeletal: No deformities Skin: Warm and dry, no rashes   Neuro: Alert and oriented to person, place, time, and situation, CNII-XII grossly intact, no focal deficits  Psych: Normal mood and affect   Labs  High Sensitivity Troponin:  No results for input(s): TROPONINIHS in the last 720 hours.   Cardiac EnzymesNo results for input(s): TROPONINI in the last 168 hours. No results for input(s): TROPIPOC in the last 168 hours.  Chemistry Recent Labs  Lab 04/06/20 0108 04/07/20 0145 04/08/20 0528  NA 139 137 138  K 4.5  4.5 4.3  CL 104 102 102  CO2 24 27 27   GLUCOSE 101* 156* 100*  BUN 15 16 18   CREATININE 0.51* 0.61 0.76  CALCIUM 8.8* 9.0 8.9  PROT 6.5 6.8 6.0*  ALBUMIN 2.3* 2.4* 2.2*  AST 179* 102* 87*  ALT 192* 208* 224*  ALKPHOS 72 76 68  BILITOT 0.4 0.6 0.4  GFRNONAA >60 >60 >60  ANIONGAP 11 8 9     Hematology Recent Labs  Lab 04/06/20 0108 04/07/20 0145 04/08/20 0528  WBC 29.2* 25.3* 25.3*  RBC 5.21 4.99 5.05  HGB 11.4* 11.3* 11.1*  HCT 38.3* 36.0* 37.3*  MCV 73.5* 72.1* 73.9*  MCH 21.9* 22.6* 22.0*  MCHC 29.8* 31.4 29.8*  RDW 17.9* 18.1* 18.4*  PLT 794* 764* 670*   BNP Recent Labs  Lab 04/03/20 1313  BNP 22.4    DDimer No results for input(s): DDIMER in the last 168 hours.   Radiology  [place] EKG SITE RITE  Result Date: 04/06/2020 If Site Rite image not attached, placement could not be confirmed due to current cardiac rhythm.  US Abdomen Limited RUQ (LIVER/GB)  Result Date: 04/06/2020 CLINICAL DATA:  Elevated liver enzymes EXAM: ULTRASOUND ABDOMEN LIMITED RIGHT UPPER QUADRANT COMPARISON:  None. FINDINGS: Gallbladder: No gallstones or wall thickening visualized. No sonographic Murphy sign noted by sonographer. Common bile duct: Diameter: 1.9 mm Liver: The left lobe is not well visualized. Visualized liver is normal. Portal vein is patent on color Doppler imaging with normal direction of blood flow towards the liver. Other: Moderate right pleural effusion. IMPRESSION: 1. Poor visualization of the left hepatic lobe. 2. Moderate right pleural effusion. Electronically Signed   By: Dorise Bullion III M.D   On: 04/06/2020 14:38    Cardiac Studies  TTE 04/04/2020 Conclusion(s)/Recommendation(s): Limited echocardiogram to assess for  pericardial effusion s/p pericardiocentesis and drain placement 04/03/20.  Continues to have large pericardial effusion, greatest diameter 2.4 cm  adjacent to the RV. This is an  improvement from prior. No evidence of tamponade, IVC is small and   collapses. There is fibrinous material seen (image 20) but the majority of  the pericardial fluid has low echodensity.  Patient Profile  25 year old male with history of mediastinal mass who was admitted on 04/01/2020 with shortness of breath and right pleural effusion.  Found to have large anterior mediastinal mass with compression of the right lung with right pleural effusion. Cardiology 04/03/2020 consulted for large pericardial effusion with features of cardiac tamponade.  Assessment & Plan   Large pericardial effusion with tamponade features: Admitted with large anterior mediastinal mass with compression of the right lung.  Found to have a very large pericardial effusion on 04/03/2020 up to 5.2 cm.  Underwent pericardiocentesis with 900 cc output with drain  placement 04/03/2020.  Surgical pathology from biopsy consistent with Hodgkin's lymphoma -Drain output down to 25cc yesterday, drain was removed.  Plan repeat echo tomorrow to monitor for reaccumulation.  Given malignant effusion, suspect he will end up needing a window -Evaluated by cardiac surgery on 04/04/2020 and due to large anterior mediastinal mass with right lung compression he is high risk to undergo general anesthesia for pericardial window.  Hopefully can get started on treatment for his lymphoma and respiratory status improves to undergo window.    For questions or updates, please contact [place] Please consult [email or web address] for contact info under   Donato Heinz, MD

## 2020-04-08 NOTE — Plan of Care (Signed)
  Problem: Health Behavior/Discharge Planning: Goal: Ability to manage health-related needs will improve Outcome: Progressing   Problem: Clinical Measurements: Goal: Ability to maintain clinical measurements within normal limits will improve Outcome: Progressing   Problem: Activity: Goal: Risk for activity intolerance will decrease Outcome: Progressing   Problem: Nutrition: Goal: Adequate nutrition will be maintained Outcome: Progressing   Problem: Coping: Goal: Level of anxiety will decrease Outcome: Progressing   Problem: Pain Managment: Goal: General experience of comfort will improve Outcome: Progressing

## 2020-04-08 NOTE — Plan of Care (Signed)

## 2020-04-09 ENCOUNTER — Inpatient Hospital Stay (HOSPITAL_COMMUNITY)

## 2020-04-09 ENCOUNTER — Encounter (HOSPITAL_COMMUNITY): Payer: Self-pay | Admitting: Internal Medicine

## 2020-04-09 DIAGNOSIS — I313 Pericardial effusion (noninflammatory): Secondary | ICD-10-CM | POA: Diagnosis not present

## 2020-04-09 DIAGNOSIS — I314 Cardiac tamponade: Secondary | ICD-10-CM | POA: Diagnosis not present

## 2020-04-09 DIAGNOSIS — D75839 Thrombocytosis, unspecified: Secondary | ICD-10-CM | POA: Diagnosis not present

## 2020-04-09 DIAGNOSIS — B159 Hepatitis A without hepatic coma: Secondary | ICD-10-CM | POA: Diagnosis not present

## 2020-04-09 DIAGNOSIS — J9859 Other diseases of mediastinum, not elsewhere classified: Secondary | ICD-10-CM

## 2020-04-09 DIAGNOSIS — R7401 Elevation of levels of liver transaminase levels: Secondary | ICD-10-CM | POA: Diagnosis not present

## 2020-04-09 DIAGNOSIS — C8172 Other classical Hodgkin lymphoma, intrathoracic lymph nodes: Secondary | ICD-10-CM | POA: Diagnosis not present

## 2020-04-09 DIAGNOSIS — C801 Malignant (primary) neoplasm, unspecified: Secondary | ICD-10-CM | POA: Diagnosis not present

## 2020-04-09 DIAGNOSIS — J90 Pleural effusion, not elsewhere classified: Secondary | ICD-10-CM | POA: Diagnosis not present

## 2020-04-09 LAB — ECHOCARDIOGRAM LIMITED
Height: 74.5 in
Weight: 2620.83 oz

## 2020-04-09 LAB — CBC
HCT: 31.5 % — ABNORMAL LOW (ref 39.0–52.0)
Hemoglobin: 9.9 g/dL — ABNORMAL LOW (ref 13.0–17.0)
MCH: 22.8 pg — ABNORMAL LOW (ref 26.0–34.0)
MCHC: 31.4 g/dL (ref 30.0–36.0)
MCV: 72.6 fL — ABNORMAL LOW (ref 80.0–100.0)
Platelets: 526 10*3/uL — ABNORMAL HIGH (ref 150–400)
RBC: 4.34 MIL/uL (ref 4.22–5.81)
RDW: 18.6 % — ABNORMAL HIGH (ref 11.5–15.5)
WBC: 28.7 10*3/uL — ABNORMAL HIGH (ref 4.0–10.5)
nRBC: 0 % (ref 0.0–0.2)

## 2020-04-09 LAB — COMPREHENSIVE METABOLIC PANEL
ALT: 209 U/L — ABNORMAL HIGH (ref 0–44)
AST: 65 U/L — ABNORMAL HIGH (ref 15–41)
Albumin: 2 g/dL — ABNORMAL LOW (ref 3.5–5.0)
Alkaline Phosphatase: 58 U/L (ref 38–126)
Anion gap: 6 (ref 5–15)
BUN: 18 mg/dL (ref 6–20)
CO2: 28 mmol/L (ref 22–32)
Calcium: 8.5 mg/dL — ABNORMAL LOW (ref 8.9–10.3)
Chloride: 103 mmol/L (ref 98–111)
Creatinine, Ser: 0.58 mg/dL — ABNORMAL LOW (ref 0.61–1.24)
GFR, Estimated: >60 mL/min (ref >60)
Glucose, Bld: 110 mg/dL — ABNORMAL HIGH (ref 70–99)
Potassium: 4.5 mmol/L (ref 3.5–5.1)
Sodium: 137 mmol/L (ref 135–145)
Total Bilirubin: 0.4 mg/dL (ref 0.3–1.2)
Total Protein: 5.3 g/dL — ABNORMAL LOW (ref 6.5–8.1)

## 2020-04-09 LAB — COMPREHENSIVE THYROGLOBULIN
Anti-Thyroglobulin Antibodies: <1 IU/mL
Thyroglobulin (ICMA): 14 ng/mL

## 2020-04-09 NOTE — Progress Notes (Addendum)
Patient refused morning medications, I educated the patient on the importance of all of his medications but he still refused. I told him to let me know if he changes his mind and I can give them to him later.    1129: Nurse Tech went in to get the patients VS and patient refused
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
## 2020-04-09 NOTE — Progress Notes (Signed)
  Echocardiogram 2D Echocardiogram limited has been performed.  Darlina Sicilian M 04/09/2020, 2:17 PM

## 2020-04-09 NOTE — Progress Notes (Signed)
Cardiology Progress Note  Patient ID: Thomas Sanders MRN: [id number] DOB: 10/10/1995 Date of Encounter: 04/09/2020  Primary Cardiologist: No primary care provider on file.  Subjective   Refusing care today.  Refuses physical exam or echo   Inpatient Medications  Scheduled Meds: . (feeding supplement) PROSource Plus  30 mL Oral Daily  . Chlorhexidine Gluconate Cloth  6 each Topical Daily  . enoxaparin (LOVENOX) injection  40 mg Subcutaneous Q24H  . feeding supplement  1 Container Oral Q24H  . feeding supplement  237 mL Oral Q24H  . LORazepam  1 mg Intravenous Once  . melatonin  3 mg Oral QHS  . methylPREDNISolone (SOLU-MEDROL) injection  40 mg Intravenous Q12H  . multivitamin with minerals  1 tablet Oral Daily  . pantoprazole  40 mg Oral Daily  . sertraline  100 mg Oral Daily  . sodium chloride flush  10-40 mL Intracatheter Q12H  . sodium chloride flush  3 mL Intravenous Q12H  . traZODone  50 mg Oral QHS   Continuous Infusions:  PRN Meds: acetaminophen **OR** acetaminophen, Cold Pack, Hot Pack, HYDROcodone-acetaminophen, HYDROmorphone (DILAUDID) injection, ipratropium-albuterol, LORazepam, polyethylene glycol, prochlorperazine, sodium chloride flush, sodium chloride flush   Vital Signs   Vitals:   04/08/20 1955 04/09/20 0001 04/09/20 0401 04/09/20 0726  BP: 114/71  (!) 101/55 114/75  Pulse: 91 92 88 90  Resp: 19 17 18 20   Temp: 97.8 F (36.6 C)  97.9 F (36.6 C)   TempSrc: Oral  Oral   SpO2: 97% 97% 98% 95%  Weight:      Height:        Intake/Output Summary (Last 24 hours) at 04/09/2020 1149 Last data filed at 04/08/2020 1955 Gross per 24 hour  Intake 820 ml  Output -  Net 820 ml   Last 3 Weights 04/07/2020 04/05/2020 04/01/2020  Weight (lbs) 163 lb 12.8 oz 159 lb 9.6 oz 173 lb  Weight (kg) 74.3 kg 72.394 kg 78.472 kg      Telemetry  Overnight telemetry shows sinus rhythm in the 80, brief sinus tachycardia up to 130s this morning, which I personally reviewed.    Physical Exam   Vitals:   04/08/20 1955 04/09/20 0001 04/09/20 0401 04/09/20 0726  BP: 114/71  (!) 101/55 114/75  Pulse: 91 92 88 90  Resp: 19 17 18 20   Temp: 97.8 F (36.6 C)  97.9 F (36.6 C)   TempSrc: Oral  Oral   SpO2: 97% 97% 98% 95%  Weight:      Height:         Intake/Output Summary (Last 24 hours) at 04/09/2020 1149 Last data filed at 04/08/2020 1955 Gross per 24 hour  Intake 820 ml  Output -  Net 820 ml    Last 3 Weights 04/07/2020 04/05/2020 04/01/2020  Weight (lbs) 163 lb 12.8 oz 159 lb 9.6 oz 173 lb  Weight (kg) 74.3 kg 72.394 kg 78.472 kg    Body mass index is 20.75 kg/m.   General:  in no acute distress Patient refuses physical exam  Labs  High Sensitivity Troponin:  No results for input(s): TROPONINIHS in the last 720 hours.   Cardiac EnzymesNo results for input(s): TROPONINI in the last 168 hours. No results for input(s): TROPIPOC in the last 168 hours.  Chemistry Recent Labs  Lab 04/07/20 0145 04/08/20 0528 04/09/20 0404  NA 137 138 137  K 4.5 4.3 4.5  CL 102 102 103  CO2 27 27 28   GLUCOSE 156* 100* 110*  BUN  16 18 18   CREATININE 0.61 0.76 0.58*  CALCIUM 9.0 8.9 8.5*  PROT 6.8 6.0* 5.3*  ALBUMIN 2.4* 2.2* 2.0*  AST 102* 87* 65*  ALT 208* 224* 209*  ALKPHOS 76 68 58  BILITOT 0.6 0.4 0.4  GFRNONAA >60 >60 >60  ANIONGAP 8 9 6     Hematology Recent Labs  Lab 04/07/20 0145 04/08/20 0528 04/09/20 0404  WBC 25.3* 25.3* 28.7*  RBC 4.99 5.05 4.34  HGB 11.3* 11.1* 9.9*  HCT 36.0* 37.3* 31.5*  MCV 72.1* 73.9* 72.6*  MCH 22.6* 22.0* 22.8*  MCHC 31.4 29.8* 31.4  RDW 18.1* 18.4* 18.6*  PLT 764* 670* 526*   BNP Recent Labs  Lab 04/03/20 1313  BNP 22.4    DDimer No results for input(s): DDIMER in the last 168 hours.   Radiology  No results found.  Cardiac Studies  TTE 04/04/2020 Conclusion(s)/Recommendation(s): Limited echocardiogram to assess for  pericardial effusion s/p pericardiocentesis and drain placement 04/03/20.   Continues to have large pericardial effusion, greatest diameter 2.4 cm  adjacent to the RV. This is an  improvement from prior. No evidence of tamponade, IVC is small and  collapses. There is fibrinous material seen (image 20) but the majority of  the pericardial fluid has low echodensity.  Patient Profile  25 year old male with history of mediastinal mass who was admitted on 04/01/2020 with shortness of breath and right pleural effusion.  Found to have large anterior mediastinal mass with compression of the right lung with right pleural effusion. Cardiology 04/03/2020 consulted for large pericardial effusion with features of cardiac tamponade.  Assessment & Plan   Large pericardial effusion with tamponade features: Admitted with large anterior mediastinal mass with compression of the right lung.  Found to have a very large pericardial effusion on 04/03/2020 up to 5.2 cm.  Underwent pericardiocentesis with 900 cc output with drain placement 04/03/2020.  Surgical pathology from biopsy consistent with Hodgkin's lymphoma -Drain output down to 25cc on 2/20, drain was removed.  Planned repeat echo today to monitor for reaccumulation.  Given malignant effusion, suspect he will end up needing a window.  Planned repeat echo today to evaluate effusion.  Patiently is refusing echo, discussed need for echo to monitor for fluid reaccumulation but he is adamant he does not want today.   -Evaluated by cardiac surgery on 04/04/2020 and due to large anterior mediastinal mass with right lung compression he is high risk to undergo general anesthesia for pericardial window.  Hopefully can get started on treatment for his lymphoma and respiratory status improves to undergo window.    For questions or updates, please contact [place] Please consult [email or web address] for contact info under   Donato Heinz, MD
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
## 2020-04-09 NOTE — Progress Notes (Cosign Needed)
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 -Discussed plan for 2 cycles of ABVD followed by PET/CT with subsequent treatment based on PET/CT results. Will need outpatient PET/CT. C1D1 planning to go with only AVD holding bleomycin pending improvement in lung function and PFTs.  If lung function poor might need to consider replacing Bleomycin with Brentuximab. -He received day 1 cycle 1 of AVD on 04/08/2020.  He tolerated his treatment well overall. -He had elevated LFTs related to hepatic congestion versus ischemic hepatopathy; LFTs are trending downward. -Paraneoplastic neutrophilia and thrombocytosis likely from Hodgkin's lymphoma should improve with treatment; platelets are trending downward. -As needed Compazine is available to him as an antiemetic. -Solu-Medrol is being tapered since he received his first cycle of chemotherapy and steroids  are no longer needed for controlling his lymphoma. -We will plan for day 15 cycle #1 04/22/2020.  We will need to get PFTs prior to day 15. -LVEF 60 to 65% on echocardiogram from 04/03/2020.  Cardiology had ordered a repeat echocardiogram to be performed earlier today.  The patient refused this morning but now states that he is agreeable to going ahead with echocardiogram.  I have reentered the order for the echo. -Recommend psychiatric consult for history of multiple psychiatric disorders. -The patient is requesting a Covid vaccine.  Will discuss timing with Dr. Irene Limbo.   LOS: 8 days   Mikey Bussing, DNP, AGPCNP-BC, AOCNP 04/09/20   ADDENDUM  .Patient was Personally and independently interviewed, examined and relevant elements of the history of present illness were reviewed in details and an assessment and plan was created. All elements of the patient's history of present illness , assessment and plan were discussed in details with . The above documentation reflects our combined findings assessment and plan.  -patient more relaxed today having talked with his mother. -appreciate psychiatry input -appreciate ID input regarding Hep A -rpt ECHO done today - normal EF, small pericardial effusion no tamponade. -patient keen to discharge soon. Will recommend consideration of rpt rt sided therapeutic thoracentesis to improvement resolution of SOB which hopefully alongwith chemotherapy will allow for re-expansion of rt lung and further stabilization of cardio-respiratory status. -oncology will continue to f/u.  Sullivan Lone MD MS
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
## 2020-04-10 ENCOUNTER — Inpatient Hospital Stay (HOSPITAL_COMMUNITY)

## 2020-04-10 ENCOUNTER — Other Ambulatory Visit: Payer: Self-pay | Admitting: Physician Assistant

## 2020-04-10 DIAGNOSIS — I3139 Other pericardial effusion (noninflammatory): Secondary | ICD-10-CM

## 2020-04-10 DIAGNOSIS — R7401 Elevation of levels of liver transaminase levels: Secondary | ICD-10-CM | POA: Diagnosis not present

## 2020-04-10 DIAGNOSIS — I313 Pericardial effusion (noninflammatory): Secondary | ICD-10-CM

## 2020-04-10 DIAGNOSIS — R609 Edema, unspecified: Secondary | ICD-10-CM | POA: Diagnosis not present

## 2020-04-10 DIAGNOSIS — J9859 Other diseases of mediastinum, not elsewhere classified: Secondary | ICD-10-CM | POA: Diagnosis not present

## 2020-04-10 DIAGNOSIS — B159 Hepatitis A without hepatic coma: Secondary | ICD-10-CM | POA: Diagnosis not present

## 2020-04-10 DIAGNOSIS — C801 Malignant (primary) neoplasm, unspecified: Secondary | ICD-10-CM | POA: Diagnosis not present

## 2020-04-10 DIAGNOSIS — I314 Cardiac tamponade: Secondary | ICD-10-CM | POA: Diagnosis not present

## 2020-04-10 LAB — PULMONARY FUNCTION TEST
DL/VA % pred: 115 %
DL/VA: 5.78 ml/min/mmHg/L
DLCO cor % pred: 68 %
DLCO cor: 24.6 ml/min/mmHg
DLCO unc % pred: 57 %
DLCO unc: 20.79 ml/min/mmHg
FEF 25-75 Pre: 3.01 L/sec
FEF2575-%Pred-Pre: 63 %
FEV1-%Pred-Pre: 65 %
FEV1-Pre: 2.84 L
FEV1FVC-%Pred-Pre: 100 %
FEV6-%Pred-Pre: 65 %
FEV6-Pre: 3.33 L
FEV6FVC-%Pred-Pre: 100 %
FVC-%Pred-Pre: 64 %
FVC-Pre: 3.33 L
Pre FEV1/FVC ratio: 85 %
Pre FEV6/FVC Ratio: 100 %
RV % pred: 122 %
RV: 2.04 L
TLC % pred: 70 %
TLC: 5.25 L

## 2020-04-10 LAB — COMPREHENSIVE METABOLIC PANEL
ALT: 160 U/L — ABNORMAL HIGH (ref 0–44)
AST: 34 U/L (ref 15–41)
Albumin: 2 g/dL — ABNORMAL LOW (ref 3.5–5.0)
Alkaline Phosphatase: 57 U/L (ref 38–126)
Anion gap: 6 (ref 5–15)
BUN: 17 mg/dL (ref 6–20)
CO2: 28 mmol/L (ref 22–32)
Calcium: 8.5 mg/dL — ABNORMAL LOW (ref 8.9–10.3)
Chloride: 106 mmol/L (ref 98–111)
Creatinine, Ser: 0.63 mg/dL (ref 0.61–1.24)
GFR, Estimated: >60 mL/min (ref >60)
Glucose, Bld: 109 mg/dL — ABNORMAL HIGH (ref 70–99)
Potassium: 4.4 mmol/L (ref 3.5–5.1)
Sodium: 140 mmol/L (ref 135–145)
Total Bilirubin: 0.4 mg/dL (ref 0.3–1.2)
Total Protein: 5.2 g/dL — ABNORMAL LOW (ref 6.5–8.1)

## 2020-04-10 LAB — CBC
HCT: 32.2 % — ABNORMAL LOW (ref 39.0–52.0)
Hemoglobin: 10.1 g/dL — ABNORMAL LOW (ref 13.0–17.0)
MCH: 22.9 pg — ABNORMAL LOW (ref 26.0–34.0)
MCHC: 31.4 g/dL (ref 30.0–36.0)
MCV: 73 fL — ABNORMAL LOW (ref 80.0–100.0)
Platelets: 468 10*3/uL — ABNORMAL HIGH (ref 150–400)
RBC: 4.41 MIL/uL (ref 4.22–5.81)
RDW: 19.2 % — ABNORMAL HIGH (ref 11.5–15.5)
WBC: 23.8 10*3/uL — ABNORMAL HIGH (ref 4.0–10.5)
nRBC: 0 % (ref 0.0–0.2)

## 2020-04-10 MED ORDER — ENSURE ENLIVE PO LIQD: 237.0000 mL | Freq: Four times a day (QID) | ORAL | 0 refills | Status: AC

## 2020-04-10 MED ORDER — TRAZODONE HCL 50 MG PO TABS: 50.0000 mg | ORAL_TABLET | Freq: Every day | ORAL | 0 refills | Status: DC

## 2020-04-10 MED ORDER — MELATONIN 3 MG PO TABS: 3.0000 mg | ORAL_TABLET | Freq: Every day | ORAL | 0 refills | Status: DC

## 2020-04-10 MED ORDER — PANTOPRAZOLE SODIUM 40 MG PO TBEC: 40.0000 mg | DELAYED_RELEASE_TABLET | Freq: Every day | ORAL | 0 refills | Status: DC

## 2020-04-10 MED ORDER — ENSURE ENLIVE PO LIQD
237.0000 mL | Freq: Four times a day (QID) | ORAL | Status: DC
  Administered 2020-04-10: 237 mL via ORAL

## 2020-04-10 MED ORDER — PREDNISONE 5 MG PO TABS: 5.0000 mg | ORAL_TABLET | Freq: Every day | ORAL | 0 refills | Status: DC

## 2020-04-10 MED ORDER — PREDNISONE 20 MG PO TABS
40.0000 mg | ORAL_TABLET | Freq: Every day | ORAL | Status: DC
  Administered 2020-04-10: 40 mg via ORAL
  Filled 2020-04-10: qty 2

## 2020-04-10 MED ORDER — ADULT MULTIVITAMIN W/MINERALS CH: 1.0000 | ORAL_TABLET | Freq: Every day | ORAL | 0 refills | Status: AC

## 2020-04-10 MED ORDER — POLYETHYLENE GLYCOL 3350 17 G PO PACK: 17.0000 g | PACK | Freq: Every day | ORAL | 0 refills | Status: DC | PRN

## 2020-04-10 NOTE — TOC Transition Note (Addendum)
Transition of Care [facility]) - CM/SW Discharge Note   Patient Details  Name: Thomas Sanders MRN: [id number] Date of Birth: 04/07/1995  [facility] CM/SW Contact:  Zenon Mayo, RN Phone Number: 04/10/2020, 1:27 PM   Clinical Narrative:    NCM faxed dc summary and AVS to [facility] at the [facility] at [phone]. Officers are here , and will take him to [facility] at discharge.  NCM was notified by MD that she discontinued  A medication.  NCM refaxed the dc summary and the AVS to the [facility].    Final next level of care: [facility] Barriers to Discharge: No Barriers Identified   Patient Goals and CMS Choice        Discharge Placement                       Discharge Plan and Services                                     Social Determinants of Health (SDOH) Interventions     Readmission Risk Interventions No flowsheet data found.

## 2020-04-10 NOTE — Progress Notes (Signed)
Dr. Gardiner Rhyme requested f/u echo 1 month and f/u Dr. Audie Box. Appts made and info placed on AVS to also bring attention to the location difference in the two appts.

## 2020-04-10 NOTE — Discharge Summary (Addendum)
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   Patient Name:   Thomas Sanders Date of Exam: 04/03/2020 Medical Rec #:  [id number]      Height:       74.5 in Accession #:    [id number]     Weight:       173.0 lb Date of Birth:  12/22/1995      BSA:          2.053 m Patient Age:    24 years       BP:           114/80 mmHg Patient Gender: M              HR:           112 bpm. Exam Location:  Inpatient Procedure: 2D Echo             STAT ECHO  Dr. Gardiner Rhyme notified at 8:24 am.  Discussed results with Dr Cyndia Skeeters at 9:10AM. Indications:    Pericardial Effusion I31.3  History:        Patient has no prior history of Echocardiogram examinations.  Sonographer:    Mikki Santee RDCS (AE) Referring Phys: [id number] [place]  1. Large pericardial effusion measuring over 4cm adjacent to RV. There is evidence of tamponade physiology with RA inversion and mitral inflow respiratory variation >25%. However no RV diastolic  collapse is seen and IVC while dilated does have respiratory variation. Correlate with clinical evidence of tamponade and recommend cardiology evaluation  2. Left ventricular ejection fraction, by estimation, is 60 to 65%. The left ventricle has normal function. The left ventricle has no regional wall motion abnormalities. Left ventricular diastolic parameters are indeterminate.  3. Right ventricular systolic function is mildly reduced. The right ventricular size is normal. There is moderately elevated pulmonary artery systolic pressure. The estimated right ventricular systolic pressure is 08.6 mmHg.  4. The mitral valve is normal in structure. No evidence of mitral valve regurgitation.  5. Tricuspid valve regurgitation is mild to moderate.  6. The aortic valve was not well visualized. Aortic valve regurgitation is not visualized.  7. The inferior vena cava is dilated in size with >50% respiratory variability, suggesting right atrial pressure of 8 mmHg. FINDINGS  Left Ventricle: Left ventricular ejection fraction, by estimation, is 60 to 65%.  The left ventricle has normal function. The left ventricle has no regional wall motion abnormalities. The left ventricular internal cavity size was small. There is no left ventricular hypertrophy. Left ventricular diastolic parameters are indeterminate. Right Ventricle: The right ventricular size is normal. Right vetricular wall thickness was not assessed. Right ventricular systolic function is mildly reduced. There is moderately elevated pulmonary artery systolic pressure. The tricuspid regurgitant velocity is 3.17 m/s, and with an assumed right atrial pressure of 8 mmHg, the estimated right ventricular systolic pressure is 62.9 mmHg. Left Atrium: Left atrial size was normal in size. Right Atrium: Right atrial size was normal in size. Pericardium: Large pericardial effusion measuring over 4cm adjacent to RV. There is evidence of tamponade physiology with RA inversion and  mitral inflow respiratory variation >25%. However no RV diastolic collapse is seen and IVC while dilated does have respiratory variation. Would recommend. A large pericardial effusion is present. Mitral Valve: The mitral valve is normal in structure. No evidence of mitral valve regurgitation. Tricuspid Valve: The tricuspid valve is normal in structure. Tricuspid valve regurgitation is mild to moderate. Aortic Valve: The aortic valve was not well visualized. Aortic valve regurgitation is not visualized. Pulmonic Valve: The pulmonic valve was not well visualized. Pulmonic valve regurgitation is not visualized. Aorta: The aortic root is normal in size and structure. Venous: The inferior vena cava is dilated in size with greater than 50% respiratory variability, suggesting right atrial pressure of 8 mmHg. IAS/Shunts: The interatrial septum was not well visualized.  LEFT VENTRICLE PLAX 2D LVIDd:         3.80 cm LVIDs:         2.50 cm LV PW:         1.00 cm LV IVS:        1.00 cm LVOT diam:     2.20 cm LV SV:         47 LV SV Index:   23 LVOT Area:     3.80 cm  LEFT ATRIUM             Index      RIGHT ATRIUM          Index LA diam:        2.20 cm 1.07 cm/m RA Area:     7.29 cm LA Vol (A2C):   20.0 ml 9.74 ml/m RA Volume:   10.40 ml 5.07 ml/m LA Vol (A4C):   12.0 ml 5.85 ml/m LA Biplane Vol: 15.6 ml 7.60 ml/m  AORTIC VALVE LVOT Vmax:   73.80 cm/s LVOT Vmean:  46.700 cm/s LVOT VTI:    0.123 m  AORTA Ao Root diam: 2.90 cm MITRAL VALVE               TRICUSPID VALVE MV Area (PHT): 5.02 cm    TR Peak grad:   40.2 mmHg MV Decel Time: 151 msec    TR Vmax:        317.00 cm/s MV E velocity: 76.30 cm/s MV A velocity: 57.40 cm/s  SHUNTS MV E/A ratio:  1.33        Systemic VTI:  0.12 m                            Systemic Diam: 2.20 cm Oswaldo Milian MD Electronically signed by Oswaldo Milian MD Signature Date/Time: 04/03/2020/9:16:32 AM    Final    VAS [place] LOWER EXTREMITY VENOUS (DVT)  Result Date: 04/10/2020  Lower Venous DVT Study Indications: Edema.  Risk Factors: Cancer. Comparison Study: No prior studies. Performing Technologist: Oliver Hum RVT  Examination Guidelines: A complete evaluation includes B-mode imaging, spectral Doppler, color Doppler, and power Doppler as needed of all accessible portions of each vessel. Bilateral testing is considered an integral part of a complete examination. Limited examinations for reoccurring indications may be performed as noted. The reflux portion of the exam is performed with the patient in reverse Trendelenburg.  +---------+---------------+---------+-----------+----------+--------------+ RIGHT    CompressibilityPhasicitySpontaneityPropertiesThrombus Aging +---------+---------------+---------+-----------+----------+--------------+ CFV      Full           Yes      Yes                                 +---------+---------------+---------+-----------+----------+--------------+ SFJ      Full                                                        +---------+---------------+---------+-----------+----------+--------------+ FV Prox  Full                                                        +---------+---------------+---------+-----------+----------+--------------+ FV Mid   Full                                                        +---------+---------------+---------+-----------+----------+--------------+ FV DistalFull                                                        +---------+---------------+---------+-----------+----------+--------------+ PFV      Full                                                        +---------+---------------+---------+-----------+----------+--------------+ POP      Full           Yes      Yes                                 +---------+---------------+---------+-----------+----------+--------------+ PTV      Full                                                         +---------+---------------+---------+-----------+----------+--------------+ PERO     Full                                                        +---------+---------------+---------+-----------+----------+--------------+   +---------+---------------+---------+-----------+----------+--------------+  LEFT     CompressibilityPhasicitySpontaneityPropertiesThrombus Aging +---------+---------------+---------+-----------+----------+--------------+ CFV      Full           Yes      Yes                                 +---------+---------------+---------+-----------+----------+--------------+ SFJ      Full                                                        +---------+---------------+---------+-----------+----------+--------------+ FV Prox  Full                                                        +---------+---------------+---------+-----------+----------+--------------+ FV Mid   Full                                                        +---------+---------------+---------+-----------+----------+--------------+ FV DistalFull                                                        +---------+---------------+---------+-----------+----------+--------------+ PFV      Full                                                        +---------+---------------+---------+-----------+----------+--------------+ POP      Full           Yes      Yes                                 +---------+---------------+---------+-----------+----------+--------------+ PTV      Full                                                        +---------+---------------+---------+-----------+----------+--------------+ PERO     Full                                                        +---------+---------------+---------+-----------+----------+--------------+     Summary: RIGHT: - There is no evidence of deep vein thrombosis in the lower extremity.  - No cystic structure found in  the popliteal fossa.  LEFT: - There is no evidence of deep vein thrombosis in the lower extremity.  - No  cystic structure found in the popliteal fossa.  *See table(s) above for measurements and observations.    Preliminary    ECHOCARDIOGRAM LIMITED  Result Date: 04/09/2020    ECHOCARDIOGRAM LIMITED REPORT   Patient Name:   Thomas Sanders Date of Exam: 04/09/2020 Medical Rec #:  [id number]      Height:       74.5 in Accession #:    [id number]     Weight:       163.8 lb Date of Birth:  [birth year]      BSA:          2.006 m Patient Age:    24 years       BP:           114/75 mmHg Patient Gender: M              HR:           96 bpm. Exam Location:  Inpatient Procedure: Limited Echo, Limited Color Doppler and Cardiac Doppler Indications:    Pericardial effusion 423.9 / I31.3  History:        Patient has prior history of Echocardiogram examinations, most                 recent 04/04/2020. Risk Factors:Current Smoker. History of                 tamponade. Mediastinal mass.  Sonographer:    Darlina Sicilian RDCS Referring Phys: [id number] [place]  1. Left ventricular ejection fraction, by estimation, is 60 to 65%. The left ventricle has normal function. The left ventricle has no regional wall motion abnormalities.  2. Right ventricular systolic function is normal. The right ventricular size is normal. There is normal pulmonary artery systolic pressure.  3. A small pericardial effusion is present.  4. The inferior vena cava is normal in size with greater than 50% respiratory variability, suggesting right atrial pressure of 3 mmHg. Conclusion(s)/Recommendation(s): Limited echo for pericardial effusion. Only very small amount of residual pericardial effusion, without evidence of tamponade. FINDINGS  Left Ventricle: Left ventricular ejection fraction, by estimation, is 60 to 65%. The left ventricle has normal function. The left ventricle has no regional wall motion abnormalities. Right Ventricle: The  right ventricular size is normal. Right ventricular systolic function is normal. There is normal pulmonary artery systolic pressure. The tricuspid regurgitant velocity is 1.15 m/s, and with an assumed right atrial pressure of 3 mmHg,  the estimated right ventricular systolic pressure is 8.3 mmHg. Pericardium: A small pericardial effusion is present. Venous: The inferior vena cava is normal in size with greater than 50% respiratory variability, suggesting right atrial pressure of 3 mmHg. TRICUSPID VALVE TR Peak grad:   5.3 mmHg TR Vmax:        115.00 cm/s Buford Dresser MD Electronically signed by Buford Dresser MD Signature Date/Time: 04/09/2020/5:55:59 PM    Final    ECHOCARDIOGRAM LIMITED  Result Date: 04/04/2020    ECHOCARDIOGRAM LIMITED REPORT   Patient Name:   Thomas Sanders Date of Exam: 04/04/2020 Medical Rec #:  [id number]      Height:       74.5 in Accession #:    [id number]     Weight:       173.0 lb Date of Birth:  1996-02-12      BSA:          2.053 m Patient Age:    24 years       BP:  129/80 mmHg Patient Gender: M              HR:           96 bpm. Exam Location:  Inpatient Procedure: Limited Echo Indications:    Pericardial effusion 423.9 / I31.3  History:        Patient has prior history of Echocardiogram examinations, most                 recent 04/03/2020. Risk Factors:Current Smoker. Tamponade.  Sonographer:    Vickie Epley RDCS Referring Phys: [id number] [place]  Conclusion(s)/Recommendation(s): Limited echocardiogram to assess for pericardial effusion s/p pericardiocentesis and drain placement 04/03/20. Continues to have large pericardial effusion, greatest diameter 2.4 cm adjacent to the RV. This is an improvement from prior. No evidence of tamponade, IVC is small and collapses. There is fibrinous material seen (image 20) but the majority of the pericardial fluid has low echodensity. Buford Dresser MD Electronically signed by Buford Dresser MD  Signature Date/Time: 04/04/2020/4:20:52 PM    Final    ECHOCARDIOGRAM LIMITED  Result Date: 04/04/2020    ECHOCARDIOGRAM LIMITED REPORT   Patient Name:   Thomas Sanders Date of Exam: 04/03/2020 Medical Rec #:  [id number]      Height:       74.5 in Accession #:    [id number]     Weight:       173.0 lb Date of Birth:  [birth year]      BSA:          2.053 m Patient Age:    24 years       BP:           0/0 mmHg Patient Gender: M              HR:           120 bpm. Exam Location:  Inpatient Procedure: Limited Echo Indications:    I31.3 Pericardial effusion  History:        Patient has prior history of Echocardiogram examinations, most                 recent 04/03/2020.  Sonographer:    Raquel Sarna Senior RDCS Referring Phys: [phone] [place]  Sonographer Comments: Pericardiocentesis IMPRESSIONS  1. Limited echo for guidance for pericardiocentesis in setting of tamponade. Large pericardial effusion measuring 5.2cm adjacent to RV. Final images show improvement with effusion measuring 2.8 cm and no RA/RV collapse. FINDINGS  Pericardium: Limited echo for guidance for pericardiocentesis in setting of tamponade. Large pericardial effusion measuring 5.2cm adjacent to RV. Final images show improvement with effusion measuring 2.8 cm and no RA/RV collapse. Oswaldo Milian MD Electronically signed by Oswaldo Milian MD Signature Date/Time: 04/04/2020/12:30:46 AM    Final    [place] EKG SITE RITE  Result Date: 04/06/2020 If Site Rite image not attached, placement could not be confirmed due to current cardiac rhythm.  [place] EKG SITE RITE  Result Date: 04/05/2020 If Site Rite image not attached, placement could not be confirmed due to current cardiac rhythm.  US Abdomen Limited RUQ (LIVER/GB)  Result Date: 04/06/2020 CLINICAL DATA:  Elevated liver enzymes EXAM: ULTRASOUND ABDOMEN LIMITED RIGHT UPPER QUADRANT COMPARISON:  None. FINDINGS: Gallbladder: No gallstones or wall thickening visualized. No sonographic Murphy sign  noted by sonographer. Common bile duct: Diameter: 1.9 mm Liver: The left lobe is not well visualized. Visualized liver is normal. Portal vein is patent on color Doppler imaging with normal direction of blood flow towards  the liver. Other: Moderate right pleural effusion. IMPRESSION: 1. Poor visualization of the left hepatic lobe. 2. Moderate right pleural effusion. Electronically Signed   By: Dorise Bullion III M.D   On: 04/06/2020 14:38    Microbiology: Recent Results (from the past 240 hour(s))  Resp Panel by RT-PCR (Flu A&B, Covid) Nasopharyngeal Swab     Status: None   Collection Time: 04/01/20 12:03 PM   Specimen: Nasopharyngeal Swab; Nasopharyngeal(NP) swabs in vial transport medium  Result Value Ref Range Status   SARS Coronavirus 2 by RT PCR NEGATIVE NEGATIVE Final    Comment: (NOTE) SARS-CoV-2 target nucleic acids are NOT DETECTED.  The SARS-CoV-2 RNA is generally detectable in upper respiratory specimens during the acute phase of infection. The lowest concentration of SARS-CoV-2 viral copies this assay can detect is 138 copies/mL. A negative result does not preclude SARS-Cov-2 infection and should not be used as the sole basis for treatment or other patient management decisions. A negative result may occur with  improper specimen collection/handling, submission of specimen other than nasopharyngeal swab, presence of viral mutation(s) within the areas targeted by this assay, and inadequate number of viral copies(<138 copies/mL). A negative result must be combined with clinical observations, patient history, and epidemiological information. The expected result is Negative.  Fact Sheet for Patients:  EntrepreneurPulse.com.au  Fact Sheet for Healthcare Providers:  IncredibleEmployment.be  This test is no t yet approved or cleared by the [place] FDA and  has been authorized for detection and/or diagnosis of SARS-CoV-2 by FDA under an  Emergency Use Authorization (EUA). This EUA will remain  in effect (meaning this test can be used) for the duration of the COVID-19 declaration under Section 564(b)(1) of the Act, 21 U.S.C.section 360bbb-3(b)(1), unless the authorization is terminated  or revoked sooner.       Influenza A by PCR NEGATIVE NEGATIVE Final   Influenza B by PCR NEGATIVE NEGATIVE Final    Comment: (NOTE) The Xpert Xpress SARS-CoV-2/FLU/RSV plus assay is intended as an aid in the diagnosis of influenza from Nasopharyngeal swab specimens and should not be used as a sole basis for treatment. Nasal washings and aspirates are unacceptable for Xpert Xpress SARS-CoV-2/FLU/RSV testing.  Fact Sheet for Patients: EntrepreneurPulse.com.au  Fact Sheet for Healthcare Providers: IncredibleEmployment.be  This test is not yet approved or cleared by the [place] FDA and has been authorized for detection and/or diagnosis of SARS-CoV-2 by FDA under an Emergency Use Authorization (EUA). This EUA will remain in effect (meaning this test can be used) for the duration of the COVID-19 declaration under Section 564(b)(1) of the Act, 21 U.S.C. section 360bbb-3(b)(1), unless the authorization is terminated or revoked.  Performed at [facility], [place] [address] [place], [place] [address]   Fungus Culture With Stain     Status: None (Preliminary result)   Collection Time: 04/01/20  5:21 PM   Specimen: Pleural Fluid  Result Value Ref Range Status   Fungus Stain Final report  Final    Comment: (NOTE) Performed At: [facility] [place], [place] [id number] Rush Farmer MD [id number]    Fungus (Mycology) Culture PENDING  Incomplete   Fungal Source PLEURAL  Final    Comment: Performed at [facility], [place] [address], [place], [place] [address]  Body fluid culture w Gram Stain     Status: None   Collection Time: 04/01/20   5:21 PM   Specimen: Pleura  Result Value Ref Range Status   Specimen  Description   Final    PLEURAL Performed at [facility], [place] [address] [place], [place] [address]    Special Requests   Final    NONE Performed at [facility], [place] [address] [place], [place] [address]    Gram Stain NO WBC SEEN NO ORGANISMS SEEN   Final   Culture   Final    NO GROWTH Performed at [facility], [place] [address] [place], [place] [address]    Report Status 04/05/2020 FINAL  Final  Fungus Culture Result     Status: None   Collection Time: 04/01/20  5:21 PM  Result Value Ref Range Status   Result 1 Comment  Final    Comment: (NOTE) KOH/Calcofluor preparation:  no fungus observed. Performed At: [facility] [place], [place] [id number] Rush Farmer MD GE:[id number]   MRSA PCR Screening     Status: None   Collection Time: 04/03/20  5:50 AM   Specimen: Nasal Mucosa; Nasopharyngeal  Result Value Ref Range Status   MRSA by PCR NEGATIVE NEGATIVE Final    Comment:        The GeneXpert MRSA Assay (FDA approved for NASAL specimens only), is one component of a comprehensive MRSA colonization surveillance program. It is not intended to diagnose MRSA infection nor to guide or monitor treatment for MRSA infections. Performed at [facility], [place] [address], [place], [place] [address]   Culture, body fluid w Gram Stain-bottle     Status: None   Collection Time: 04/03/20  3:58 PM   Specimen: Fluid  Result Value Ref Range Status   Specimen Description FLUID PERICARDIAL  Final   Special Requests BOTTLES DRAWN AEROBIC AND ANAEROBIC  Final   Culture   Final    NO GROWTH 5 DAYS Performed at [facility], [place] [address] [place], [place] [address]    Report Status 04/08/2020 FINAL  Final  Gram stain     Status: None   Collection Time: 04/03/20  3:58 PM   Specimen: Fluid  Result Value Ref Range Status    Specimen Description FLUID PERICARDIAL  Final   Special Requests NONE  Final   Gram Stain   Final    WBC PRESENT,BOTH PMN AND MONONUCLEAR NO ORGANISMS SEEN CYTOSPIN SMEAR Performed at [facility], [address] [place], [place] [address]    Report Status 04/03/2020 FINAL  Final     Labs: Basic Metabolic Panel: Recent Labs  Lab 04/06/20 0108 04/07/20 0145 04/08/20 0528 04/09/20 0404 04/10/20 0529  NA 139 137 138 137 140  K 4.5 4.5 4.3 4.5 4.4  CL 104 102 102 103 106  CO2 24 27 27 28 28   GLUCOSE 101* 156* 100* 110* 109*  BUN 15 16 18 18 17   CREATININE 0.51* 0.61 0.76 0.58* 0.63  CALCIUM 8.8* 9.0 8.9 8.5* 8.5*   Liver Function Tests: Recent Labs  Lab 04/06/20 0108 04/07/20 0145 04/08/20 0528 04/09/20 0404 04/10/20 0529  AST 179* 102* 87* 65* 34  ALT 192* 208* 224* 209* 160*  ALKPHOS 72 76 68 58 57  BILITOT 0.4 0.6 0.4 0.4 0.4  PROT 6.5 6.8 6.0* 5.3* 5.2*  ALBUMIN 2.3* 2.4* 2.2* 2.0* 2.0*   No results for input(s): LIPASE, AMYLASE in the last 168 hours. No results for input(s): AMMONIA in the last 168 hours. CBC: Recent Labs  Lab 04/06/20 0108 04/07/20 0145 04/08/20 0528 04/09/20 0404 04/10/20 0529  WBC  29.2* 25.3* 25.3* 28.7* 23.8*  NEUTROABS 25.7*  --   --   --   --   HGB 11.4* 11.3* 11.1* 9.9* 10.1*  HCT 38.3* 36.0* 37.3* 31.5* 32.2*  MCV 73.5* 72.1* 73.9* 72.6* 73.0*  PLT 794* 764* 670* 526* 468*   Cardiac Enzymes: No results for input(s): CKTOTAL, CKMB, CKMBINDEX, TROPONINI in the last 168 hours. BNP: BNP (last 3 results) Recent Labs    04/03/20 1313  BNP 22.4    ProBNP (last 3 results) No results for input(s): PROBNP in the last 8760 hours.  CBG: No results for input(s): GLUCAP in the last 168 hours.     Signed:  Kayleen Memos, MD [facility] 04/10/2020, 2:00 PM

## 2020-04-10 NOTE — Plan of Care (Signed)

## 2020-04-10 NOTE — Progress Notes (Signed)
Bilateral lower extremity venous duplex has been completed. Preliminary results can be found in CV Proc through chart review.   04/10/20 1:25 PM Thomas Sanders RVT

## 2020-04-10 NOTE — Progress Notes (Signed)
Cardiology Progress Note  Patient ID: Coner Gibbard MRN: [id number] DOB: [birth year] Date of Encounter: 04/10/2020  Primary Cardiologist: No primary care provider on file.  Subjective   Echo yesterday shows no residual pericardial effusion.  Denies any dyspnea or chest pain   Inpatient Medications  Scheduled Meds: . (feeding supplement) PROSource Plus  30 mL Oral Daily  . Chlorhexidine Gluconate Cloth  6 each Topical Daily  . enoxaparin (LOVENOX) injection  40 mg Subcutaneous Q24H  . feeding supplement  1 Container Oral Q24H  . feeding supplement  237 mL Oral Q24H  . LORazepam  1 mg Intravenous Once  . melatonin  3 mg Oral QHS  . methylPREDNISolone (SOLU-MEDROL) injection  40 mg Intravenous Q12H  . multivitamin with minerals  1 tablet Oral Daily  . pantoprazole  40 mg Oral Daily  . sertraline  100 mg Oral Daily  . sodium chloride flush  10-40 mL Intracatheter Q12H  . sodium chloride flush  3 mL Intravenous Q12H  . traZODone  50 mg Oral QHS   Continuous Infusions:  PRN Meds: acetaminophen **OR** acetaminophen, Cold Pack, HYDROcodone-acetaminophen, HYDROmorphone (DILAUDID) injection, ipratropium-albuterol, LORazepam, polyethylene glycol, prochlorperazine, sodium chloride flush, sodium chloride flush   Vital Signs   Vitals:   04/09/20 1925 04/10/20 0351 04/10/20 0525 04/10/20 0809  BP: 112/65 (!) 97/47 (!) 106/55 (!) 99/46  Pulse: 100 80 86 90  Resp: 15 14  20   Temp: 98.5 F (36.9 C) 97.9 F (36.6 C) 97.7 F (36.5 C)   TempSrc: Oral Oral Oral   SpO2:  97% 96% 96%  Weight:      Height:        Intake/Output Summary (Last 24 hours) at 04/10/2020 1029 Last data filed at 04/10/2020 2993 Gross per 24 hour  Intake 390 ml  Output --  Net 390 ml   Last 3 Weights 04/07/2020 04/05/2020 04/01/2020  Weight (lbs) 163 lb 12.8 oz 159 lb 9.6 oz 173 lb  Weight (kg) 74.3 kg 72.394 kg 78.472 kg      Telemetry  Overnight telemetry shows sinus rhythm in the 80, brief sinus  tachycardia up to 130s this morning, which I personally reviewed.   Physical Exam   Vitals:   04/09/20 1925 04/10/20 0351 04/10/20 0525 04/10/20 0809  BP: 112/65 (!) 97/47 (!) 106/55 (!) 99/46  Pulse: 100 80 86 90  Resp: 15 14  20   Temp: 98.5 F (36.9 C) 97.9 F (36.6 C) 97.7 F (36.5 C)   TempSrc: Oral Oral Oral   SpO2:  97% 96% 96%  Weight:      Height:         Intake/Output Summary (Last 24 hours) at 04/10/2020 1029 Last data filed at 04/10/2020 0906 Gross per 24 hour  Intake 390 ml  Output --  Net 390 ml    Last 3 Weights 04/07/2020 04/05/2020 04/01/2020  Weight (lbs) 163 lb 12.8 oz 159 lb 9.6 oz 173 lb  Weight (kg) 74.3 kg 72.394 kg 78.472 kg    Body mass index is 20.75 kg/m.   General:  in no acute distress Patient refuses physical exam  Labs  High Sensitivity Troponin:  No results for input(s): TROPONINIHS in the last 720 hours.   Cardiac EnzymesNo results for input(s): TROPONINI in the last 168 hours. No results for input(s): TROPIPOC in the last 168 hours.  Chemistry Recent Labs  Lab 04/08/20 0528 04/09/20 0404 04/10/20 0529  NA 138 137 140  K 4.3 4.5 4.4  CL 102 103  106  CO2 27 28 28   GLUCOSE 100* 110* 109*  BUN 18 18 17   CREATININE 0.76 0.58* 0.63  CALCIUM 8.9 8.5* 8.5*  PROT 6.0* 5.3* 5.2*  ALBUMIN 2.2* 2.0* 2.0*  AST 87* 65* 34  ALT 224* 209* 160*  ALKPHOS 68 58 57  BILITOT 0.4 0.4 0.4  GFRNONAA >60 >60 >60  ANIONGAP 9 6 6     Hematology Recent Labs  Lab 04/08/20 0528 04/09/20 0404 04/10/20 0529  WBC 25.3* 28.7* 23.8*  RBC 5.05 4.34 4.41  HGB 11.1* 9.9* 10.1*  HCT 37.3* 31.5* 32.2*  MCV 73.9* 72.6* 73.0*  MCH 22.0* 22.8* 22.9*  MCHC 29.8* 31.4 31.4  RDW 18.4* 18.6* 19.2*  PLT 670* 526* 468*   BNP Recent Labs  Lab 04/03/20 1313  BNP 22.4    DDimer No results for input(s): DDIMER in the last 168 hours.   Radiology  ECHOCARDIOGRAM LIMITED  Result Date: 04/09/2020    ECHOCARDIOGRAM LIMITED REPORT   Patient Name:   JOHNATHYN VISCOMI Date of Exam: 04/09/2020 Medical Rec #:  [id number]      Height:       74.5 in Accession #:    [id number]     Weight:       163.8 lb Date of Birth:  Apr 21, 1995      BSA:          2.006 m Patient Age:    25 years       BP:           114/75 mmHg Patient Gender: M              HR:           96 bpm. Exam Location:  Inpatient Procedure: Limited Echo, Limited Color Doppler and Cardiac Doppler Indications:    Pericardial effusion 423.9 / I31.3  History:        Patient has prior history of Echocardiogram examinations, most                 recent 04/04/2020. Risk Factors:Current Smoker. History of                 tamponade. Mediastinal mass.  Sonographer:    Darlina Sicilian RDCS Referring Phys: [id number] [place]  1. Left ventricular ejection fraction, by estimation, is 60 to 65%. The left ventricle has normal function. The left ventricle has no regional wall motion abnormalities.  2. Right ventricular systolic function is normal. The right ventricular size is normal. There is normal pulmonary artery systolic pressure.  3. A small pericardial effusion is present.  4. The inferior vena cava is normal in size with greater than 50% respiratory variability, suggesting right atrial pressure of 3 mmHg. Conclusion(s)/Recommendation(s): Limited echo for pericardial effusion. Only very small amount of residual pericardial effusion, without evidence of tamponade. FINDINGS  Left Ventricle: Left ventricular ejection fraction, by estimation, is 60 to 65%. The left ventricle has normal function. The left ventricle has no regional wall motion abnormalities. Right Ventricle: The right ventricular size is normal. Right ventricular systolic function is normal. There is normal pulmonary artery systolic pressure. The tricuspid regurgitant velocity is 1.15 m/s, and with an assumed right atrial pressure of 3 mmHg,  the estimated right ventricular systolic pressure is 8.3 mmHg. Pericardium: A small pericardial effusion  is present. Venous: The inferior vena cava is normal in size with greater than 50% respiratory variability, suggesting right atrial pressure of 3 mmHg. TRICUSPID VALVE TR Peak grad:  5.3 mmHg TR Vmax:        115.00 cm/s Buford Dresser MD Electronically signed by Buford Dresser MD Signature Date/Time: 04/09/2020/5:55:59 PM    Final     Cardiac Studies  TTE 04/04/2020 Conclusion(s)/Recommendation(s): Limited echocardiogram to assess for  pericardial effusion s/p pericardiocentesis and drain placement 04/03/20.  Continues to have large pericardial effusion, greatest diameter 2.4 cm  adjacent to the RV. This is an  improvement from prior. No evidence of tamponade, IVC is small and  collapses. There is fibrinous material seen (image 20) but the majority of  the pericardial fluid has low echodensity.  Patient Profile  25 year old male with history of mediastinal mass who was admitted on 04/01/2020 with shortness of breath and right pleural effusion.  Found to have large anterior mediastinal mass with compression of the right lung with right pleural effusion. Cardiology 04/03/2020 consulted for large pericardial effusion with features of cardiac tamponade.  Assessment & Plan   Large pericardial effusion with tamponade features: Admitted with large anterior mediastinal mass with compression of the right lung.  Found to have a very large pericardial effusion on 04/03/2020 up to 5.2 cm.  Underwent pericardiocentesis with 900 cc output with drain placement 04/03/2020.  Surgical pathology from biopsy of mass consistent with Hodgkin's lymphoma.  Cytology from pericardial fluid did not show evidence of malignancy -Evaluated by cardiac surgery on 04/04/2020 and due to large anterior mediastinal mass with right lung compression he is high risk to undergo general anesthesia for pericardial window.  Recommended holding off on window -Drain output down to 25cc on 2/20, drain was removed.  Repeat  echocardiogram 2/22 does not show any residual effusion.  We will plan repeat echocardiogram in 1 month to monitor for reaccumulation.  Hopefully as his lymphoma is treated the effusion does not recur  [facility] will sign off.   Medication Recommendations:  No changes Other recommendations (labs, testing, etc):  We will order repeat echo in 1 month Follow up as an outpatient:  Follow-up with Dr. Audie Box following echocardiogram    For questions or updates, please contact [facility] Please consult [email or web address] for contact info under   Donato Heinz, MD

## 2020-04-10 NOTE — Progress Notes (Signed)
IR consulted by Dr. Nevada Crane for possible image-guided right thoracentesis.  Limited chest US revealed no fluid that could be safely accessed with procedure today. Images sent to Dr. Dwaine Gale for review. Informed patient that procedure will not occur today. All questions answered and concerns addressed. Will make Dr. Nevada Crane aware.  IR available in future if needed.   Bea Graff Darvin Dials, PA-C 04/10/2020, 12:26 PM

## 2020-04-10 NOTE — Progress Notes (Signed)
[facility] for Infectious Disease    Date of Admission:  04/01/2020     ID: Thomas Sanders is a 25 y.o. 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 (feeding supplement) PROSource Plus  30 mL Oral Daily  . Chlorhexidine Gluconate Cloth  6 each Topical Daily  . enoxaparin (LOVENOX) injection  40 mg Subcutaneous Q24H  . feeding supplement  1 Container Oral Q24H  . feeding supplement  237 mL Oral Q24H  . LORazepam  1 mg Intravenous Once  . melatonin  3 mg Oral QHS  . methylPREDNISolone (SOLU-MEDROL) injection  40 mg Intravenous Q12H  . multivitamin with minerals  1 tablet Oral Daily  . pantoprazole  40 mg Oral Daily  . sertraline  100 mg Oral Daily  . sodium chloride flush  10-40 mL Intracatheter Q12H  . sodium chloride flush  3 mL Intravenous Q12H  . traZODone  50 mg Oral QHS    Objective: Vital signs in last 24 hours: Temp:  [97.7 F (36.5 C)-98.5 F (36.9 C)] 97.7 F (36.5 C) (02/23 0525) Pulse Rate:  [76-100] 90 (02/23 0809) Resp:  [14-22] 20 (02/23 0809) BP: (97-117)/(46-65) 99/46 (02/23 0809) SpO2:  [96 %-97 %] 96 % (02/23 0809) Physical Exam  Constitutional: He is oriented to person, place, and time. He appears well-developed and well-nourished. No distress.  HENT:  Mouth/Throat: Oropharynx is clear and moist. No oropharyngeal exudate.  Abdominal: Soft. Bowel sounds are normal. He exhibits no distension. There is no tenderness.  Neurological: He is alert and oriented to person, place, and time.  Skin: Skin is warm and dry. No rash noted. No erythema.  Psychiatric: He has a normal mood and affect. His  behavior is normal.     Lab Results Recent Labs    04/09/20 0404 04/10/20 0529  WBC 28.7* 23.8*  HGB 9.9* 10.1*  HCT 31.5* 32.2*  NA 137 140  K 4.5 4.4  CL 103 106  CO2 28 28  BUN 18 17  CREATININE 0.58* 0.63   Liver Panel Recent Labs    04/09/20 0404 04/10/20 0529  PROT 5.3* 5.2*  ALBUMIN 2.0* 2.0*  AST 65* 34  ALT 209* 160*  ALKPHOS 58 57  BILITOT 0.4 0.4    Microbiology: reviewed Studies/Results: DG CHEST PORT 1 VIEW  Result Date: 04/10/2020 CLINICAL DATA:  Right pleural effusion. EXAM: PORTABLE CHEST 1 VIEW COMPARISON:  Chest radiograph 04/05/2020 and CT chest 04/01/2020. FINDINGS: Right PICC tip projects over the SVC RA junction. Heart size grossly stable. Large mediastinal mass occupies the right chest. Small to moderate right pleural effusion. There may be loculated pleural air in the mid and lower right hemithorax. Left lung is clear. IMPRESSION: 1. Small to moderate loculated right pleural effusion with suspected small amount of pleural air. Patient underwent pericardiocentesis 04/03/2020. Please correlate clinically. These results will be called to the ordering clinician or representative by the Radiologist Assistant, and communication documented in the PACS or [employer]. 2. Large mediastinal mass, as on 04/01/2020. Electronically Signed   By: Lorin Picket M.D.   On: 04/10/2020 11:00   ECHOCARDIOGRAM LIMITED  Result Date: 04/09/2020  ECHOCARDIOGRAM LIMITED REPORT   Patient Name:   Thomas Sanders Date of Exam: 04/09/2020 Medical Rec #:  [id number]      Height:       74.5 in Accession #:    [id number]     Weight:       163.8 lb Date of Birth:  Jun 08, 1995      BSA:          2.006 m Patient Age:    24 years       BP:           114/75 mmHg Patient Gender: M              HR:           96 bpm. Exam Location:  Inpatient Procedure: Limited Echo, Limited Color Doppler and Cardiac Doppler Indications:    Pericardial effusion 423.9 / I31.3  History:        Patient has  prior history of Echocardiogram examinations, most                 recent 04/04/2020. Risk Factors:Current Smoker. History of                 tamponade. Mediastinal mass.  Sonographer:    Darlina Sicilian RDCS Referring Phys: [id number] [place]  1. Left ventricular ejection fraction, by estimation, is 60 to 65%. The left ventricle has normal function. The left ventricle has no regional wall motion abnormalities.  2. Right ventricular systolic function is normal. The right ventricular size is normal. There is normal pulmonary artery systolic pressure.  3. A small pericardial effusion is present.  4. The inferior vena cava is normal in size with greater than 50% respiratory variability, suggesting right atrial pressure of 3 mmHg. Conclusion(s)/Recommendation(s): Limited echo for pericardial effusion. Only very small amount of residual pericardial effusion, without evidence of tamponade. FINDINGS  Left Ventricle: Left ventricular ejection fraction, by estimation, is 60 to 65%. The left ventricle has normal function. The left ventricle has no regional wall motion abnormalities. Right Ventricle: The right ventricular size is normal. Right ventricular systolic function is normal. There is normal pulmonary artery systolic pressure. The tricuspid regurgitant velocity is 1.15 m/s, and with an assumed right atrial pressure of 3 mmHg,  the estimated right ventricular systolic pressure is 8.3 mmHg. Pericardium: A small pericardial effusion is present. Venous: The inferior vena cava is normal in size with greater than 50% respiratory variability, suggesting right atrial pressure of 3 mmHg. 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 In terms of isolation, w will keep on enteric isolation for the time being ( to wash hands with soap and water) for IC for the next week.  Health maintenance = check for hep B surface antibody, to see that he is immune. Also, looking to see if he can receive evusheld, monoclonal ab, prophylaxis for covid illness prior to discharge  [facility] for Infectious Diseases Cell: [phone] Pager: [phone]  04/10/2020, 11:29 AM

## 2020-04-10 NOTE — Progress Notes (Signed)
      [address]       [place] [address]             [phone]    Echo reviewed -  No residual pericardial effusion  No further surgery on pericardium needed at this time Please call if can be of assistance    Grace Isaac MD [place] Office [phone] 04/10/2020 7:52 AM

## 2020-04-10 NOTE — Progress Notes (Signed)
   04/10/20 1500  Clinical Encounter Type  Visited With Patient  Visit Type Initial  Referral From Nurse  Consult/Referral To Chaplain  The chaplain responded to consult for spiritual support. The chaplain spoke to the patient and explained the types of services provided by spiritual care. The patient decided he was ok for now and will call if he needs additional support. Law enforcement was present and the patient was hesitant to speak.

## 2020-04-10 NOTE — Progress Notes (Signed)
Limited pericardial effusion per Dr. Gardiner Rhyme

## 2020-04-10 NOTE — Discharge Instructions (Signed)
Hodgkin Lymphoma, Adult  Hodgkin lymphoma, also called Hodgkin disease, is a cancer that affects the lymphatic system. The lymphatic system is part of the body's defense system (immune system), which protects the body from infections, germs, and diseases. Hodgkin lymphoma often affects white blood cells and the lymph nodes. Hodgkin lymphoma can spread from lymph node to lymph node and to areas of the body where there is lymph tissue, including to the center of the bones (bone marrow). Advanced Hodgkin lymphoma can spread into blood vessels and be carried almost anywhere in the body. There are two types of Hodgkin lymphoma:  Classical Hodgkin lymphoma.  Nodular lymphocyte-predominant Hodgkin lymphoma (NLPHL). What are the causes? The cause is not known. What increases the risk? You may be more likely to develop Hodgkin lymphoma if:  You are male.  You have been infected with the virus that causes mononucleosis (Epstein-Barr virus).  You have a brother or sister with Hodgkin lymphoma.  You have a weakened immune system.  You have HIV.  You have a personal or family history of autoimmune conditions such as rheumatoid arthritis, systemic lupus erythematosus (SLE), or sarcoidosis. What are the signs or symptoms? The first sign of Hodgkin lymphoma is often a painless swelling in a lymph node. The swelling may be felt in the neck, under the arm, or in the groin. Other symptoms include:  Fever.  Drenching night sweats.  Feeling tired all the time.  Cough.  Shortness of breath.  Itchy skin.  Loss of appetite.  Weight loss.  Pain in the lymph nodes after drinking alcohol. How is this diagnosed? This condition may be diagnosed based on your medical history and symptoms, a physical exam, and a procedure in which a tissue sample is removed from a lymph node and then examined under a microscope (biopsy). You may also have other tests to find out how advanced the cancer is and whether  it has spread. This is called staging. These tests may include:  Blood tests.  Imaging tests, such as: ? A chest X-ray. ? A CT scan. ? An MRI. ? A PET scan.  A bone marrow biopsy to see if the disease has spread to the bone marrow. How is this treated? Your treatment will depend on the type of Hodgkin lymphoma you have, the stage of your cancer, your age, and your overall health. Treatment usually starts with one of the following:  Chemotherapy. Chemotherapy is the use of medicines to stop or slow the growth of cancer cells.  Radiation therapy. Radiation therapy is the use of high-energy X-rays to kill cancer cells.  A combination of chemotherapy and radiation therapy. If chemotherapy and radiation therapy are not completely successful, you may have treatment with:  Targeted therapy. This treatment targets specific parts of cancer cells and the area around them to block the growth and spread of cancer.  Very high doses of chemotherapy followed by a stem cell transplant. Stem cells are cells that help your body develop new healthy blood cells after chemotherapy. Follow these instructions at home: Eating and drinking  Do not take dietary supplements or herbal medicines unless your health care provider tells you to take them. Some supplements can interfere with how well the treatment works.  Try to eat regular, healthy meals. Some of your treatments might affect your appetite. Lifestyle  Make sure you are getting enough sleep on a regular basis. Most adults need 6-8 hours of sleep each night. During treatment, you may need more sleep.  Consider joining a cancer support group. Ask your health care provider for more information about local and online support groups.  Do not use any products that contain nicotine or tobacco, such as cigarettes, e-cigarettes, and chewing tobacco. If you need help quitting, ask your health care provider.   General instructions  Take over-the-counter  and prescription medicines only as directed by your health care provider.  Keep all follow-up visits as told by your health care provider. This is important during and after treatment.  Cancer treatment may increase your risk for other cancers and infections. After treatment: ? Make sure you get all vaccinations as recommended by your health care provider. ? Have regular cancer screenings as recommended by your health care provider.   Where to find more information  American Cancer Society (ACS): www.cancer.org  Leukemia and Lymphoma Society (LLS): PreviewPal.pl  National Cancer Institute ([place]): www.cancer.gov Contact a health care provider if:  You have a fever or other signs of infection.  You develop new symptoms or your old symptoms come back. Get help right away if:  You have chest pain.  You have trouble breathing. Summary  Hodgkin lymphoma, also called Hodgkin disease, is a cancer that affects the lymphatic system.  The first sign of Hodgkin lymphoma is often a painless swelling in a lymph node. The swelling may be felt in the neck, under the arm, or in the groin.  Your treatment will depend on the type of cancer cells you have, the stage of your cancer, your age, and your overall health. Treatment may include a combination of chemotherapy and radiation therapy.  Keep all follow-up visits as told by your health care provider. This is important during and after treatment. This information is not intended to replace advice given to you by your health care provider. Make sure you discuss any questions you have with your health care provider. Document Revised: 09/08/2018 Document Reviewed: 11/18/2017 Elsevier Patient Education  2021 [employer].

## 2020-04-18 ENCOUNTER — Telehealth: Payer: Self-pay | Admitting: *Deleted

## 2020-04-18 NOTE — Telephone Encounter (Signed)
Contacted by [name] - Richard Tapp. He confirmed patient name and dob. Call back # [phone] He asked to have patient's appt schedule for Monday 04/22/20 emailed to him: [email or web address] .[place] Email sent

## 2020-04-19 ENCOUNTER — Encounter (HOSPITAL_COMMUNITY): Payer: Self-pay | Admitting: Emergency Medicine

## 2020-04-19 ENCOUNTER — Emergency Department (HOSPITAL_COMMUNITY)

## 2020-04-19 ENCOUNTER — Other Ambulatory Visit: Payer: Self-pay

## 2020-04-19 ENCOUNTER — Emergency Department (HOSPITAL_COMMUNITY)
Admission: EM | Admit: 2020-04-19 | Discharge: 2020-04-19 | Attending: Emergency Medicine | Admitting: Emergency Medicine

## 2020-04-19 DIAGNOSIS — D383 Neoplasm of uncertain behavior of mediastinum: Secondary | ICD-10-CM | POA: Diagnosis not present

## 2020-04-19 DIAGNOSIS — R0609 Other forms of dyspnea: Secondary | ICD-10-CM | POA: Diagnosis not present

## 2020-04-19 DIAGNOSIS — Z20822 Contact with and (suspected) exposure to covid-19: Secondary | ICD-10-CM | POA: Insufficient documentation

## 2020-04-19 DIAGNOSIS — C8190 Hodgkin lymphoma, unspecified, unspecified site: Secondary | ICD-10-CM | POA: Insufficient documentation

## 2020-04-19 DIAGNOSIS — K4090 Unilateral inguinal hernia, without obstruction or gangrene, not specified as recurrent: Secondary | ICD-10-CM | POA: Diagnosis not present

## 2020-04-19 DIAGNOSIS — Z79899 Other long term (current) drug therapy: Secondary | ICD-10-CM | POA: Insufficient documentation

## 2020-04-19 DIAGNOSIS — F17200 Nicotine dependence, unspecified, uncomplicated: Secondary | ICD-10-CM | POA: Diagnosis not present

## 2020-04-19 DIAGNOSIS — J9859 Other diseases of mediastinum, not elsewhere classified: Secondary | ICD-10-CM

## 2020-04-19 DIAGNOSIS — R0602 Shortness of breath: Secondary | ICD-10-CM | POA: Diagnosis present

## 2020-04-19 DIAGNOSIS — R0600 Dyspnea, unspecified: Secondary | ICD-10-CM

## 2020-04-19 LAB — CBC
HCT: 33.3 % — ABNORMAL LOW (ref 39.0–52.0)
Hemoglobin: 9.7 g/dL — ABNORMAL LOW (ref 13.0–17.0)
MCH: 22.8 pg — ABNORMAL LOW (ref 26.0–34.0)
MCHC: 29.1 g/dL — ABNORMAL LOW (ref 30.0–36.0)
MCV: 78.4 fL — ABNORMAL LOW (ref 80.0–100.0)
Platelets: 354 10*3/uL (ref 150–400)
RBC: 4.25 MIL/uL (ref 4.22–5.81)
RDW: 22.9 % — ABNORMAL HIGH (ref 11.5–15.5)
WBC: 6.6 10*3/uL (ref 4.0–10.5)
nRBC: 0 % (ref 0.0–0.2)

## 2020-04-19 LAB — COMPREHENSIVE METABOLIC PANEL
ALT: 33 U/L (ref 0–44)
AST: 18 U/L (ref 15–41)
Albumin: 3.3 g/dL — ABNORMAL LOW (ref 3.5–5.0)
Alkaline Phosphatase: 60 U/L (ref 38–126)
Anion gap: 8 (ref 5–15)
BUN: 9 mg/dL (ref 6–20)
CO2: 27 mmol/L (ref 22–32)
Calcium: 8.8 mg/dL — ABNORMAL LOW (ref 8.9–10.3)
Chloride: 106 mmol/L (ref 98–111)
Creatinine, Ser: 0.69 mg/dL (ref 0.61–1.24)
GFR, Estimated: >60 mL/min (ref >60)
Glucose, Bld: 77 mg/dL (ref 70–99)
Potassium: 3.4 mmol/L — ABNORMAL LOW (ref 3.5–5.1)
Sodium: 141 mmol/L (ref 135–145)
Total Bilirubin: 0.5 mg/dL (ref 0.3–1.2)
Total Protein: 6.7 g/dL (ref 6.5–8.1)

## 2020-04-19 LAB — RESP PANEL BY RT-PCR (FLU A&B, COVID) ARPGX2
Influenza A by PCR: NEGATIVE
Influenza B by PCR: NEGATIVE
SARS Coronavirus 2 by RT PCR: NEGATIVE

## 2020-04-19 MED ORDER — BENZONATATE 100 MG PO CAPS: 100.0000 mg | ORAL_CAPSULE | Freq: Three times a day (TID) | ORAL | 0 refills | Status: AC
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
## 2020-04-19 NOTE — Discharge Instructions (Addendum)
Your x-ray today showed improved aeration of your lungs. Follow-up with the oncology and CT surgeons as scheduled for management of your lymphoma. You do have an inguinal hernia, which be treated with Tylenol, ibuprofen, and ice as needed.  As you continue to cough, this will likely cause your hernia to persist. Take cough medicine 3 times a day. If it is helping, your doctor can refill it as needed.  Return to the emergency room if you develop fevers, persistent vomiting, severe worsening pain at the groin, redness/swelling at the groin, or any new, worsening, or concerning symptoms

## 2020-04-19 NOTE — ED Triage Notes (Signed)
Patient is in custody. Patient had an xray and ultrasound that showed pleural effusion. Patient has paperwork at bedside.

## 2020-04-21 NOTE — Progress Notes (Signed)
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 is dilated in size with greater than 50% respiratory variability, suggesting right atrial pressure of 8 mmHg. IAS/Shunts: The interatrial septum was not well visualized.  LEFT VENTRICLE PLAX 2D LVIDd:         3.80 cm LVIDs:         2.50 cm LV PW:         1.00 cm LV IVS:        1.00 cm LVOT diam:     2.20 cm LV SV:         47 LV SV Index:   23 LVOT Area:     3.80 cm  LEFT ATRIUM             Index      RIGHT ATRIUM          Index LA diam:        2.20 cm 1.07 cm/m RA Area:     7.29 cm LA Vol (A2C):   20.0 ml 9.74 ml/m RA Volume:   10.40 ml 5.07 ml/m LA Vol (A4C):   12.0 ml 5.85 ml/m LA Biplane Vol: 15.6 ml 7.60 ml/m  AORTIC VALVE LVOT Vmax:   73.80 cm/s LVOT Vmean:  46.700 cm/s LVOT VTI:    0.123 m  AORTA Ao Root diam: 2.90 cm MITRAL VALVE  TRICUSPID VALVE MV Area (PHT): 5.02 cm    TR Peak grad:   40.2 mmHg MV Decel Time: 151 msec    TR Vmax:        317.00 cm/s MV E velocity: 76.30 cm/s MV A velocity: 57.40 cm/s  SHUNTS MV E/A ratio:  1.33        Systemic VTI:  0.12 m                            Systemic Diam: 2.20 cm Oswaldo Milian MD Electronically signed by Oswaldo Milian  MD Signature Date/Time: 04/03/2020/9:16:32 AM    Final    IR US CHEST  Result Date: 04/10/2020 CLINICAL DATA:  25 year old male with right hemithorax opacification presents today measure radiology for thoracentesis. EXAM: CHEST ULTRASOUND COMPARISON:  CT guided chest biopsy 04/02/2020 FINDINGS: Minimal right pleural fluid, insufficient for aspiration. IMPRESSION: Minimal right pleural effusion, insufficient for aspiration. Right chest opacification predominantly due to known mass. Electronically Signed   By: Miachel Roux M.D.   On: 04/10/2020 13:58   VAS [place] LOWER EXTREMITY VENOUS (DVT)  Result Date: 04/10/2020  Lower Venous DVT Study Indications: Edema.  Risk Factors: Cancer. Comparison Study: No prior studies. Performing Technologist: Oliver Hum RVT  Examination Guidelines: A complete evaluation includes B-mode imaging, spectral Doppler, color Doppler, and power Doppler as needed of all accessible portions of each vessel. Bilateral testing is considered an integral part of a complete examination. Limited examinations for reoccurring indications may be performed as noted. The reflux portion of the exam is performed with the patient in reverse Trendelenburg.  +---------+---------------+---------+-----------+----------+--------------+ RIGHT    CompressibilityPhasicitySpontaneityPropertiesThrombus Aging +---------+---------------+---------+-----------+----------+--------------+ CFV      Full           Yes      Yes                                 +---------+---------------+---------+-----------+----------+--------------+ SFJ      Full                                                        +---------+---------------+---------+-----------+----------+--------------+ FV Prox  Full                                                        +---------+---------------+---------+-----------+----------+--------------+ FV Mid   Full                                                         +---------+---------------+---------+-----------+----------+--------------+ FV DistalFull                                                        +---------+---------------+---------+-----------+----------+--------------+ PFV      Full                                                        +---------+---------------+---------+-----------+----------+--------------+  POP      Full           Yes      Yes                                 +---------+---------------+---------+-----------+----------+--------------+ PTV      Full                                                        +---------+---------------+---------+-----------+----------+--------------+ PERO     Full                                                        +---------+---------------+---------+-----------+----------+--------------+   +---------+---------------+---------+-----------+----------+--------------+ LEFT     CompressibilityPhasicitySpontaneityPropertiesThrombus Aging +---------+---------------+---------+-----------+----------+--------------+ CFV      Full           Yes      Yes                                 +---------+---------------+---------+-----------+----------+--------------+ SFJ      Full                                                        +---------+---------------+---------+-----------+----------+--------------+ FV Prox  Full                                                        +---------+---------------+---------+-----------+----------+--------------+ FV Mid   Full                                                        +---------+---------------+---------+-----------+----------+--------------+ FV DistalFull                                                        +---------+---------------+---------+-----------+----------+--------------+ PFV      Full                                                         +---------+---------------+---------+-----------+----------+--------------+ POP      Full           Yes      Yes                                 +---------+---------------+---------+-----------+----------+--------------+  PTV      Full                                                        +---------+---------------+---------+-----------+----------+--------------+ PERO     Full                                                        +---------+---------------+---------+-----------+----------+--------------+     Summary: RIGHT: - There is no evidence of deep vein thrombosis in the lower extremity.  - No cystic structure found in the popliteal fossa.  LEFT: - There is no evidence of deep vein thrombosis in the lower extremity.  - No cystic structure found in the popliteal fossa.  *See table(s) above for measurements and observations. Electronically signed by Curt Jews MD on 04/10/2020 at 2:52:48 PM.    Final    ECHOCARDIOGRAM LIMITED  Result Date: 04/09/2020    ECHOCARDIOGRAM LIMITED REPORT   Patient Name:   HOMMER CUNLIFFE Date of Exam: 04/09/2020 Medical Rec #:  [id number]      Height:       74.5 in Accession #:    [id number]     Weight:       163.8 lb Date of Birth:  07-05-1995      BSA:          2.006 m Patient Age:    24 years       BP:           114/75 mmHg Patient Gender: M              HR:           96 bpm. Exam Location:  Inpatient Procedure: Limited Echo, Limited Color Doppler and Cardiac Doppler Indications:    Pericardial effusion 423.9 / I31.3  History:        Patient has prior history of Echocardiogram examinations, most                 recent 04/04/2020. Risk Factors:Current Smoker. History of                 tamponade. Mediastinal mass.  Sonographer:    Darlina Sicilian RDCS Referring Phys: [id number] [place]  1. Left ventricular ejection fraction, by estimation, is 60 to 65%. The left ventricle has normal function. The left ventricle has no regional wall motion  abnormalities.  2. Right ventricular systolic function is normal. The right ventricular size is normal. There is normal pulmonary artery systolic pressure.  3. A small pericardial effusion is present.  4. The inferior vena cava is normal in size with greater than 50% respiratory variability, suggesting right atrial pressure of 3 mmHg. Conclusion(s)/Recommendation(s): Limited echo for pericardial effusion. Only very small amount of residual pericardial effusion, without evidence of tamponade. FINDINGS  Left Ventricle: Left ventricular ejection fraction, by estimation, is 60 to 65%. The left ventricle has normal function. The left ventricle has no regional wall motion abnormalities. Right Ventricle: The right ventricular size is normal. Right ventricular systolic function is normal. There is normal pulmonary artery systolic pressure. The tricuspid regurgitant velocity is 1.15 m/s, and with an assumed right  atrial pressure of 3 mmHg,  the estimated right ventricular systolic pressure is 8.3 mmHg. Pericardium: A small pericardial effusion is present. Venous: The inferior vena cava is normal in size with greater than 50% respiratory variability, suggesting right atrial pressure of 3 mmHg. TRICUSPID VALVE TR Peak grad:   5.3 mmHg TR Vmax:        115.00 cm/s Buford Dresser MD Electronically signed by Buford Dresser MD Signature Date/Time: 04/09/2020/5:55:59 PM    Final    ECHOCARDIOGRAM LIMITED  Result Date: 04/04/2020    ECHOCARDIOGRAM LIMITED REPORT   Patient Name:   AULDEN CALISE Date of Exam: 04/04/2020 Medical Rec #:  [id number]      Height:       74.5 in Accession #:    [id number]     Weight:       173.0 lb Date of Birth:  08-Jun-1995      BSA:          2.053 m Patient Age:    24 years       BP:           129/80 mmHg Patient Gender: M              HR:           96 bpm. Exam Location:  Inpatient Procedure: Limited Echo Indications:    Pericardial effusion 423.9 / I31.3  History:        Patient has prior  history of Echocardiogram examinations, most                 recent 04/03/2020. Risk Factors:Current Smoker. Tamponade.  Sonographer:    Vickie Epley RDCS Referring Phys: [id number] [place]  Conclusion(s)/Recommendation(s): Limited echocardiogram to assess for pericardial effusion s/p pericardiocentesis and drain placement 04/03/20. Continues to have large pericardial effusion, greatest diameter 2.4 cm adjacent to the RV. This is an improvement from prior. No evidence of tamponade, IVC is small and collapses. There is fibrinous material seen (image 20) but the majority of the pericardial fluid has low echodensity. Buford Dresser MD Electronically signed by Buford Dresser MD Signature Date/Time: 04/04/2020/4:20:52 PM    Final    ECHOCARDIOGRAM LIMITED  Result Date: 04/04/2020    ECHOCARDIOGRAM LIMITED REPORT   Patient Name:   QUAMAINE WEBB Date of Exam: 04/03/2020 Medical Rec #:  [id number]      Height:       74.5 in Accession #:    [id number]     Weight:       173.0 lb Date of Birth:  February 01, 1996      BSA:          2.053 m Patient Age:    24 years       BP:           0/0 mmHg Patient Gender: M              HR:           120 bpm. Exam Location:  Inpatient Procedure: Limited Echo Indications:    I31.3 Pericardial effusion  History:        Patient has prior history of Echocardiogram examinations, most                 recent 04/03/2020.  Sonographer:    Raquel Sarna Senior RDCS Referring Phys: [phone] MUHAMMAD A ARIDA  Sonographer Comments: Pericardiocentesis IMPRESSIONS  1. Limited echo for guidance for pericardiocentesis in setting of tamponade. Large pericardial effusion measuring 5.2cm adjacent to  RV. Final images show improvement with effusion measuring 2.8 cm and no RA/RV collapse. FINDINGS  Pericardium: Limited echo for guidance for pericardiocentesis in setting of tamponade. Large pericardial effusion measuring 5.2cm adjacent to RV. Final images show improvement with effusion measuring 2.8 cm and no  RA/RV collapse. Oswaldo Milian MD Electronically signed by Oswaldo Milian MD Signature Date/Time: 04/04/2020/12:30:46 AM    Final    [place] EKG SITE RITE  Result Date: 04/06/2020 If Site Rite image not attached, placement could not be confirmed due to current cardiac rhythm.  [place] EKG SITE RITE  Result Date: 04/05/2020 If Site Rite image not attached, placement could not be confirmed due to current cardiac rhythm.  US Abdomen Limited RUQ (LIVER/GB)  Result Date: 04/06/2020 CLINICAL DATA:  Elevated liver enzymes EXAM: ULTRASOUND ABDOMEN LIMITED RIGHT UPPER QUADRANT COMPARISON:  None. FINDINGS: Gallbladder: No gallstones or wall thickening visualized. No sonographic Murphy sign noted by sonographer. Common bile duct: Diameter: 1.9 mm Liver: The left lobe is not well visualized. Visualized liver is normal. Portal vein is patent on color Doppler imaging with normal direction of blood flow towards the liver. Other: Moderate right pleural effusion. IMPRESSION: 1. Poor visualization of the left hepatic lobe. 2. Moderate right pleural effusion. 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 Unless the hernia is stuck, would not recommend surgery. Advised pt to work on straining less and reducing  cough. -Advised pt that the fluid has not accumulated in last scan and it appears as though the lung has expanded. There will be less fluid build up as the treatment drives the cancer back. -Advised pt his Hepatitis A is improving. This is the reason for improved liver counts. -Will get PET scan for baseline prior to next treatment. -Advised pt he will be receiving the Bleomycin today as counts have improved.  -Advised pt that the fluid may take 1-2 cycles to dry up. The pt notes he can feel the fluid when he lays in certain areas. Advised pt he may have positional secretions due to the mass moving / affected due to the treatment start. -Discussed Rx Ensure. The pt wishes to have this refilled as he only was on a 7 day supply. He wishes to get this TID as previously prescribed. -Discussed differences between PET and CT exams. -Will see back in 2 weeks with C2D1. Will get PET prior to this visit.   FOLLOW UP: Pet/ct in 7-10 days Plz schedule next 2 doses of ABVD with labs and MD visits (Patient needs an infusion room bed and MD will see patient in infusion room-- patient is a Tree surgeon).   All of the patients questions were answered with apparent satisfaction. The patient knows to call the clinic with any problems, questions or concerns.  I spent 25 minutes counseling the patient face to face. The total time spent in the appointment  was 30 minutes and more than 50% was on counseling and direct patient cares, ordering and management of chemotherapy.    Sullivan Lone MD [place] [facility] Hematology/Oncology Physician [facility]  (Office):       [phone] (Work cell):  [phone] (Fax):           [phone]  04/22/2020 12:57 PM  I, Reinaldo Raddle, am acting as scribe for Dr. Sullivan Lone, MD.   .I have reviewed the above documentation for accuracy and completeness, and I agree with the above. Brunetta Genera MD

## 2020-04-22 ENCOUNTER — Inpatient Hospital Stay (HOSPITAL_BASED_OUTPATIENT_CLINIC_OR_DEPARTMENT_OTHER): Admitting: Hematology

## 2020-04-22 ENCOUNTER — Other Ambulatory Visit: Payer: Self-pay

## 2020-04-22 ENCOUNTER — Inpatient Hospital Stay

## 2020-04-22 ENCOUNTER — Inpatient Hospital Stay: Attending: Hematology

## 2020-04-22 VITALS — BP 116/66 | HR 75 | Temp 97.9°F | Resp 16

## 2020-04-22 DIAGNOSIS — C8172 Other classical Hodgkin lymphoma, intrathoracic lymph nodes: Secondary | ICD-10-CM

## 2020-04-22 DIAGNOSIS — Z5111 Encounter for antineoplastic chemotherapy: Secondary | ICD-10-CM | POA: Diagnosis not present

## 2020-04-22 DIAGNOSIS — Z79899 Other long term (current) drug therapy: Secondary | ICD-10-CM | POA: Diagnosis not present

## 2020-04-22 DIAGNOSIS — Z7189 Other specified counseling: Secondary | ICD-10-CM

## 2020-04-22 DIAGNOSIS — C8193 Hodgkin lymphoma, unspecified, intra-abdominal lymph nodes: Secondary | ICD-10-CM | POA: Insufficient documentation

## 2020-04-22 LAB — CBC WITH DIFFERENTIAL (CANCER CENTER ONLY)
Abs Immature Granulocytes: 0.02 10*3/uL (ref 0.00–0.07)
Basophils Absolute: 0 10*3/uL (ref 0.0–0.1)
Basophils Relative: 1 %
Eosinophils Absolute: 0.7 10*3/uL — ABNORMAL HIGH (ref 0.0–0.5)
Eosinophils Relative: 16 %
HCT: 35.4 % — ABNORMAL LOW (ref 39.0–52.0)
Hemoglobin: 10.8 g/dL — ABNORMAL LOW (ref 13.0–17.0)
Immature Granulocytes: 0 %
Lymphocytes Relative: 36 %
Lymphs Abs: 1.6 10*3/uL (ref 0.7–4.0)
MCH: 23.4 pg — ABNORMAL LOW (ref 26.0–34.0)
MCHC: 30.5 g/dL (ref 30.0–36.0)
MCV: 76.8 fL — ABNORMAL LOW (ref 80.0–100.0)
Monocytes Absolute: 0.4 10*3/uL (ref 0.1–1.0)
Monocytes Relative: 9 %
Neutro Abs: 1.7 10*3/uL (ref 1.7–7.7)
Neutrophils Relative %: 38 %
Platelet Count: 272 10*3/uL (ref 150–400)
RBC: 4.61 MIL/uL (ref 4.22–5.81)
RDW: 23.7 % — ABNORMAL HIGH (ref 11.5–15.5)
WBC Count: 4.4 10*3/uL (ref 4.0–10.5)
nRBC: 0 % (ref 0.0–0.2)

## 2020-04-22 LAB — CMP (CANCER CENTER ONLY)
ALT: 25 U/L (ref 0–44)
AST: 14 U/L — ABNORMAL LOW (ref 15–41)
Albumin: 2.9 g/dL — ABNORMAL LOW (ref 3.5–5.0)
Alkaline Phosphatase: 73 U/L (ref 38–126)
Anion gap: 6 (ref 5–15)
BUN: 6 mg/dL (ref 6–20)
CO2: 25 mmol/L (ref 22–32)
Calcium: 8.7 mg/dL — ABNORMAL LOW (ref 8.9–10.3)
Chloride: 111 mmol/L (ref 98–111)
Creatinine: 0.72 mg/dL (ref 0.61–1.24)
GFR, Estimated: >60 mL/min (ref >60)
Glucose, Bld: 81 mg/dL (ref 70–99)
Potassium: 4 mmol/L (ref 3.5–5.1)
Sodium: 142 mmol/L (ref 135–145)
Total Bilirubin: 0.4 mg/dL (ref 0.3–1.2)
Total Protein: 6.3 g/dL — ABNORMAL LOW (ref 6.5–8.1)

## 2020-04-22 MED ORDER — SODIUM CHLORIDE 0.9 % IV SOLN
150.0000 mg | Freq: Once | INTRAVENOUS | Status: AC
  Administered 2020-04-22: 150 mg via INTRAVENOUS
  Filled 2020-04-22: qty 150

## 2020-04-22 MED ORDER — SODIUM CHLORIDE 0.9 % IV SOLN
375.0000 mg/m2 | Freq: Once | INTRAVENOUS | Status: AC
  Administered 2020-04-22: 730 mg via INTRAVENOUS
  Filled 2020-04-22: qty 73

## 2020-04-22 MED ORDER — SODIUM CHLORIDE 0.9 % IV SOLN
Freq: Once | INTRAVENOUS | Status: AC
  Filled 2020-04-22: qty 250

## 2020-04-22 MED ORDER — ACETAMINOPHEN 325 MG PO TABS
650.0000 mg | ORAL_TABLET | Freq: Once | ORAL | Status: AC
  Administered 2020-04-22: 650 mg via ORAL

## 2020-04-22 MED ORDER — VINBLASTINE SULFATE CHEMO INJECTION 1 MG/ML
6.1400 mg/m2 | Freq: Once | INTRAVENOUS | Status: AC
  Administered 2020-04-22: 12 mg via INTRAVENOUS
  Filled 2020-04-22: qty 12

## 2020-04-22 MED ORDER — DOXORUBICIN HCL CHEMO IV INJECTION 2 MG/ML
25.0000 mg/m2 | Freq: Once | INTRAVENOUS | Status: AC
  Administered 2020-04-22: 48 mg via INTRAVENOUS
  Filled 2020-04-22: qty 24

## 2020-04-22 MED ORDER — SODIUM CHLORIDE 0.9 % IV SOLN
10.0000 [IU]/m2 | Freq: Once | INTRAVENOUS | Status: AC
  Administered 2020-04-22: 20 [IU] via INTRAVENOUS
  Filled 2020-04-22: qty 6.67

## 2020-04-22 MED ORDER — SODIUM CHLORIDE 0.9 % IV SOLN
10.0000 mg | Freq: Once | INTRAVENOUS | Status: AC
  Administered 2020-04-22: 10 mg via INTRAVENOUS
  Filled 2020-04-22: qty 10

## 2020-04-22 MED ORDER — ACETAMINOPHEN 325 MG PO TABS
ORAL_TABLET | ORAL | Status: AC
  Filled 2020-04-22: qty 2

## 2020-04-22 MED ORDER — PALONOSETRON HCL INJECTION 0.25 MG/5ML
0.2500 mg | Freq: Once | INTRAVENOUS | Status: AC
  Administered 2020-04-22: 0.25 mg via INTRAVENOUS

## 2020-04-22 MED ORDER — PALONOSETRON HCL INJECTION 0.25 MG/5ML
INTRAVENOUS | Status: AC
  Filled 2020-04-22: qty 5

## 2020-04-22 NOTE — Patient Instructions (Signed)
[facility] Discharge Instructions for Patients Receiving Chemotherapy  Today you received the following chemotherapy agents: Adriamycin, Velban, Bleocin, DTIC  To help prevent nausea and vomiting after your treatment, we encourage you to take your nausea medication as directed.    If you develop nausea and vomiting that is not controlled by your nausea medication, call the clinic.   BELOW ARE SYMPTOMS THAT SHOULD BE REPORTED IMMEDIATELY:  *FEVER GREATER THAN 100.5 F  *CHILLS WITH OR WITHOUT FEVER  NAUSEA AND VOMITING THAT IS NOT CONTROLLED WITH YOUR NAUSEA MEDICATION  *UNUSUAL SHORTNESS OF BREATH  *UNUSUAL BRUISING OR BLEEDING  TENDERNESS IN MOUTH AND THROAT WITH OR WITHOUT PRESENCE OF ULCERS  *URINARY PROBLEMS  *BOWEL PROBLEMS  UNUSUAL RASH Items with * indicate a potential emergency and should be followed up as soon as possible.  Feel free to call the clinic should you have any questions or concerns. The clinic phone number is [phone].  Please show the [place] at check-in to the Emergency Department and triage nurse.

## 2020-04-25 ENCOUNTER — Other Ambulatory Visit: Payer: Self-pay | Admitting: Hematology

## 2020-04-25 DIAGNOSIS — C8172 Other classical Hodgkin lymphoma, intrathoracic lymph nodes: Secondary | ICD-10-CM

## 2020-05-01 LAB — FUNGUS CULTURE RESULT

## 2020-05-01 LAB — FUNGAL ORGANISM REFLEX

## 2020-05-01 LAB — FUNGUS CULTURE WITH STAIN

## 2020-05-06 ENCOUNTER — Encounter (HOSPITAL_COMMUNITY): Payer: Self-pay | Admitting: Cardiology

## 2020-05-06 ENCOUNTER — Other Ambulatory Visit (HOSPITAL_COMMUNITY)

## 2020-05-07 ENCOUNTER — Inpatient Hospital Stay

## 2020-05-07 ENCOUNTER — Telehealth: Payer: Self-pay | Admitting: *Deleted

## 2020-05-07 ENCOUNTER — Inpatient Hospital Stay: Admitting: Hematology

## 2020-05-07 NOTE — Telephone Encounter (Signed)
Patient did not come to scheduled appts today at [facility]. Contacted Dion Body, Deputy w/[employer] [phone]. Per Deputy Tapp, patient was sentenced and sent to [facility] in [place], [place] on 05/04/20. Dep. Tapp gave phone # for [facility] [phone], or [phone]. Attempted contact with these numbers. VM not identified as [employer] - no message left.

## 2020-05-08 ENCOUNTER — Telehealth: Payer: Self-pay | Admitting: *Deleted

## 2020-05-08 NOTE — Telephone Encounter (Signed)
[facility] [place] scheduler contacted by [employer] - scheduler with [employer] in [place]. CB# for [place] [phone]. Fax # for [employer] [phone]. She states she is calling to r/s appts patient missed on 05/07/20. Scheduling will contact her with his new appts. Dr. Irene Limbo requested PET scheduled ASAP. Scheduled PET for [facility] Friday, 3/25 @ 0800 - arrive @ 0730 - NPO after midnight. Contacted scheduler at prison, informed her of time of PET. She verbalized understanding of times and location.

## 2020-05-10 ENCOUNTER — Other Ambulatory Visit: Payer: Self-pay

## 2020-05-10 ENCOUNTER — Ambulatory Visit: Admitting: Cardiovascular Disease

## 2020-05-10 ENCOUNTER — Encounter (HOSPITAL_COMMUNITY)
Admission: RE | Admit: 2020-05-10 | Discharge: 2020-05-10 | Disposition: A | Discharge status: Home / Self Care | Source: Ambulatory Visit | Attending: Hematology | Admitting: Hematology

## 2020-05-10 DIAGNOSIS — C8172 Other classical Hodgkin lymphoma, intrathoracic lymph nodes: Secondary | ICD-10-CM | POA: Diagnosis not present

## 2020-05-10 LAB — GLUCOSE, CAPILLARY: Glucose-Capillary: 68 mg/dL — ABNORMAL LOW (ref 70–99)

## 2020-05-10 MED ORDER — FLUDEOXYGLUCOSE F - 18 (FDG) INJECTION
7.5000 | Freq: Once | INTRAVENOUS | Status: AC
  Administered 2020-05-10: 7.55 via INTRAVENOUS
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
## 2020-05-13 ENCOUNTER — Inpatient Hospital Stay

## 2020-05-13 ENCOUNTER — Inpatient Hospital Stay: Admitting: Hematology

## 2020-05-13 ENCOUNTER — Telehealth: Payer: Self-pay | Admitting: *Deleted

## 2020-05-13 ENCOUNTER — Telehealth: Payer: Self-pay | Admitting: Hematology

## 2020-05-13 NOTE — Telephone Encounter (Signed)
Scheduled appts per Dr Irene Limbo. Called and left msg for Nurse Deeann Saint at [facility] where patient is currently residing. Faxed schedule as well.

## 2020-05-13 NOTE — Telephone Encounter (Addendum)
Patient did not come for appts this morning. Contacted Ms Lidia Collum - scheduler at [facility] [phone] to determine if they received schedule faxed to them on Friday by [place].  Schedule was not received. Also spoke with Nurse Sabra Heck Nurse Sabra Heck [facility] [phone]. She asked that schedule be called to her as well as faxed to [phone]. Notified Dr. Irene Limbo, scheduler and Assist Dir that patient not coming today. Dr. Irene Limbo asked that appts be r/s. Dr. Irene Limbo states he may refer patient to [facility] as care will be closer to patient's location since he was moved from [facility] to [place].
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
## 2020-05-17 ENCOUNTER — Inpatient Hospital Stay

## 2020-05-17 ENCOUNTER — Ambulatory Visit

## 2020-05-17 ENCOUNTER — Other Ambulatory Visit: Payer: Self-pay

## 2020-05-17 ENCOUNTER — Inpatient Hospital Stay: Attending: Hematology | Admitting: Hematology

## 2020-05-17 VITALS — BP 107/66 | HR 60 | Temp 97.8°F | Resp 18 | Ht 73.0 in | Wt 165.4 lb

## 2020-05-17 DIAGNOSIS — Z7189 Other specified counseling: Secondary | ICD-10-CM

## 2020-05-17 DIAGNOSIS — C8192 Hodgkin lymphoma, unspecified, intrathoracic lymph nodes: Secondary | ICD-10-CM | POA: Insufficient documentation

## 2020-05-17 DIAGNOSIS — C8172 Other classical Hodgkin lymphoma, intrathoracic lymph nodes: Secondary | ICD-10-CM | POA: Diagnosis not present

## 2020-05-17 DIAGNOSIS — Z5111 Encounter for antineoplastic chemotherapy: Secondary | ICD-10-CM | POA: Diagnosis not present

## 2020-05-17 LAB — CBC WITH DIFFERENTIAL/PLATELET
Abs Immature Granulocytes: 0.1 10*3/uL — ABNORMAL HIGH (ref 0.00–0.07)
Basophils Absolute: 0.1 10*3/uL (ref 0.0–0.1)
Basophils Relative: 1 %
Eosinophils Absolute: 0.2 10*3/uL (ref 0.0–0.5)
Eosinophils Relative: 3 %
HCT: 41.9 % (ref 39.0–52.0)
Hemoglobin: 12.6 g/dL — ABNORMAL LOW (ref 13.0–17.0)
Immature Granulocytes: 1 %
Lymphocytes Relative: 34 %
Lymphs Abs: 2.5 10*3/uL (ref 0.7–4.0)
MCH: 24 pg — ABNORMAL LOW (ref 26.0–34.0)
MCHC: 30.1 g/dL (ref 30.0–36.0)
MCV: 79.8 fL — ABNORMAL LOW (ref 80.0–100.0)
Monocytes Absolute: 1 10*3/uL (ref 0.1–1.0)
Monocytes Relative: 14 %
Neutro Abs: 3.4 10*3/uL (ref 1.7–7.7)
Neutrophils Relative %: 47 %
Platelets: 413 10*3/uL — ABNORMAL HIGH (ref 150–400)
RBC: 5.25 MIL/uL (ref 4.22–5.81)
RDW: 23.8 % — ABNORMAL HIGH (ref 11.5–15.5)
WBC: 7.3 10*3/uL (ref 4.0–10.5)
nRBC: 0 % (ref 0.0–0.2)

## 2020-05-17 LAB — CMP (CANCER CENTER ONLY)
ALT: 26 U/L (ref 0–44)
AST: 19 U/L (ref 15–41)
Albumin: 3.7 g/dL (ref 3.5–5.0)
Alkaline Phosphatase: 57 U/L (ref 38–126)
Anion gap: 12 (ref 5–15)
BUN: 8 mg/dL (ref 6–20)
CO2: 23 mmol/L (ref 22–32)
Calcium: 9.2 mg/dL (ref 8.9–10.3)
Chloride: 107 mmol/L (ref 98–111)
Creatinine: 0.78 mg/dL (ref 0.61–1.24)
GFR, Estimated: >60 mL/min (ref >60)
Glucose, Bld: 62 mg/dL — ABNORMAL LOW (ref 70–99)
Potassium: 4.4 mmol/L (ref 3.5–5.1)
Sodium: 142 mmol/L (ref 135–145)
Total Bilirubin: 0.2 mg/dL — ABNORMAL LOW (ref 0.3–1.2)
Total Protein: 7.1 g/dL (ref 6.5–8.1)

## 2020-05-17 LAB — SAMPLE TO BLOOD BANK

## 2020-05-17 LAB — SEDIMENTATION RATE: Sed Rate: 8 mm/hr (ref 0–16)

## 2020-05-17 MED ORDER — DOXORUBICIN HCL CHEMO IV INJECTION 2 MG/ML
25.0000 mg/m2 | Freq: Once | INTRAVENOUS | Status: AC
  Administered 2020-05-17: 48 mg via INTRAVENOUS
  Filled 2020-05-17: qty 24

## 2020-05-17 MED ORDER — PALONOSETRON HCL INJECTION 0.25 MG/5ML
INTRAVENOUS | Status: AC
  Filled 2020-05-17: qty 5

## 2020-05-17 MED ORDER — SODIUM CHLORIDE 0.9 % IV SOLN
375.0000 mg/m2 | Freq: Once | INTRAVENOUS | Status: DC
  Filled 2020-05-17: qty 73

## 2020-05-17 MED ORDER — SODIUM CHLORIDE 0.9 % IV SOLN
10.0000 [IU]/m2 | Freq: Once | INTRAVENOUS | Status: DC
  Filled 2020-05-17: qty 6.7

## 2020-05-17 MED ORDER — VINBLASTINE SULFATE CHEMO INJECTION 1 MG/ML
12.0000 mg | Freq: Once | INTRAVENOUS | Status: DC
  Filled 2020-05-17: qty 12

## 2020-05-17 MED ORDER — SODIUM CHLORIDE 0.9 % IV SOLN
Freq: Once | INTRAVENOUS | Status: AC
  Filled 2020-05-17: qty 250

## 2020-05-17 MED ORDER — PALONOSETRON HCL INJECTION 0.25 MG/5ML
0.2500 mg | Freq: Once | INTRAVENOUS | Status: AC
  Administered 2020-05-17: 0.25 mg via INTRAVENOUS

## 2020-05-17 MED ORDER — SODIUM CHLORIDE 0.9 % IV SOLN
150.0000 mg | Freq: Once | INTRAVENOUS | Status: AC
  Administered 2020-05-17: 150 mg via INTRAVENOUS
  Filled 2020-05-17: qty 150

## 2020-05-17 MED ORDER — SODIUM CHLORIDE 0.9 % IV SOLN
10.0000 mg | Freq: Once | INTRAVENOUS | Status: AC
  Administered 2020-05-17: 10 mg via INTRAVENOUS
  Filled 2020-05-17: qty 10

## 2020-05-17 NOTE — Progress Notes (Signed)
checking for blood return from his peripheral IV prior to his adriamycin push we had no blood return. After problem solving it with another RN a new IV was needed. Patinet refused and decided to not proceed with treatment today. Dr. Was notified and came and saw patient in infusion room. IV was de-acessed and discharged

## 2020-05-20 ENCOUNTER — Inpatient Hospital Stay

## 2020-05-20 ENCOUNTER — Telehealth: Payer: Self-pay | Admitting: Hematology

## 2020-05-20 ENCOUNTER — Inpatient Hospital Stay: Admitting: Hematology

## 2020-05-20 NOTE — Telephone Encounter (Signed)
Scheduled follow-up appointments per 4/1 los. Prison is aware.

## 2020-05-27 ENCOUNTER — Inpatient Hospital Stay

## 2020-05-27 ENCOUNTER — Inpatient Hospital Stay: Admitting: Hematology

## 2020-05-29 NOTE — Progress Notes (Signed)
Contacted Prison Nurse Sabra Heck and then spoke to Dr Quentin Cornwall regarding pt's treatment. MD aware of treatment and aware that pt is coming tomorrow. MD stated pt does have a right to refuse treatment but his intent is for pt to be treated here at [facility] since pt was already established here. MD stated if pt needs a port placed they will make sure he gets one. Made MD aware pt refused to be given another IV when he was here last time and his original IV blew. MD stated he was unaware of that detail. Current plan for pt to come in am for treatment.
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
## 2020-05-30 ENCOUNTER — Inpatient Hospital Stay

## 2020-05-30 ENCOUNTER — Inpatient Hospital Stay (HOSPITAL_BASED_OUTPATIENT_CLINIC_OR_DEPARTMENT_OTHER): Admitting: Hematology

## 2020-05-30 ENCOUNTER — Other Ambulatory Visit: Payer: Self-pay | Admitting: Hematology

## 2020-05-30 ENCOUNTER — Other Ambulatory Visit: Payer: Self-pay

## 2020-05-30 VITALS — BP 108/62 | HR 62 | Temp 98.2°F | Resp 18

## 2020-05-30 DIAGNOSIS — C8172 Other classical Hodgkin lymphoma, intrathoracic lymph nodes: Secondary | ICD-10-CM

## 2020-05-30 DIAGNOSIS — Z7189 Other specified counseling: Secondary | ICD-10-CM

## 2020-05-30 DIAGNOSIS — Z5111 Encounter for antineoplastic chemotherapy: Secondary | ICD-10-CM

## 2020-05-30 LAB — CMP (CANCER CENTER ONLY)
ALT: 51 U/L — ABNORMAL HIGH (ref 0–44)
AST: 41 U/L (ref 15–41)
Albumin: 3.8 g/dL (ref 3.5–5.0)
Alkaline Phosphatase: 50 U/L (ref 38–126)
Anion gap: 9 (ref 5–15)
BUN: 8 mg/dL (ref 6–20)
CO2: 27 mmol/L (ref 22–32)
Calcium: 9.7 mg/dL (ref 8.9–10.3)
Chloride: 106 mmol/L (ref 98–111)
Creatinine: 0.81 mg/dL (ref 0.61–1.24)
GFR, Estimated: >60 mL/min (ref >60)
Glucose, Bld: 77 mg/dL (ref 70–99)
Potassium: 4.3 mmol/L (ref 3.5–5.1)
Sodium: 142 mmol/L (ref 135–145)
Total Bilirubin: 0.2 mg/dL — ABNORMAL LOW (ref 0.3–1.2)
Total Protein: 7.5 g/dL (ref 6.5–8.1)

## 2020-05-30 LAB — CBC WITH DIFFERENTIAL/PLATELET
Abs Immature Granulocytes: 0.01 10*3/uL (ref 0.00–0.07)
Basophils Absolute: 0 10*3/uL (ref 0.0–0.1)
Basophils Relative: 1 %
Eosinophils Absolute: 0.4 10*3/uL (ref 0.0–0.5)
Eosinophils Relative: 8 %
HCT: 40.5 % (ref 39.0–52.0)
Hemoglobin: 12.5 g/dL — ABNORMAL LOW (ref 13.0–17.0)
Immature Granulocytes: 0 %
Lymphocytes Relative: 35 %
Lymphs Abs: 1.5 10*3/uL (ref 0.7–4.0)
MCH: 24.8 pg — ABNORMAL LOW (ref 26.0–34.0)
MCHC: 30.9 g/dL (ref 30.0–36.0)
MCV: 80.4 fL (ref 80.0–100.0)
Monocytes Absolute: 0.4 10*3/uL (ref 0.1–1.0)
Monocytes Relative: 10 %
Neutro Abs: 2.1 10*3/uL (ref 1.7–7.7)
Neutrophils Relative %: 46 %
Platelets: 206 10*3/uL (ref 150–400)
RBC: 5.04 MIL/uL (ref 4.22–5.81)
RDW: 23.2 % — ABNORMAL HIGH (ref 11.5–15.5)
WBC: 4.4 10*3/uL (ref 4.0–10.5)
nRBC: 0 % (ref 0.0–0.2)

## 2020-05-30 LAB — SEDIMENTATION RATE: Sed Rate: 29 mm/hr — ABNORMAL HIGH (ref 0–16)

## 2020-05-30 LAB — SAMPLE TO BLOOD BANK

## 2020-05-30 MED ORDER — SODIUM CHLORIDE 0.9 % IV SOLN
10.0000 mg | Freq: Once | INTRAVENOUS | Status: AC
  Administered 2020-05-30: 10 mg via INTRAVENOUS
  Filled 2020-05-30: qty 10

## 2020-05-30 MED ORDER — PALONOSETRON HCL INJECTION 0.25 MG/5ML
0.2500 mg | Freq: Once | INTRAVENOUS | Status: AC
  Administered 2020-05-30: 0.25 mg via INTRAVENOUS

## 2020-05-30 MED ORDER — SODIUM CHLORIDE 0.9 % IV SOLN
375.0000 mg/m2 | Freq: Once | INTRAVENOUS | Status: AC
  Administered 2020-05-30: 730 mg via INTRAVENOUS
  Filled 2020-05-30: qty 73

## 2020-05-30 MED ORDER — FOSAPREPITANT DIMEGLUMINE INJECTION 150 MG
150.0000 mg | Freq: Once | INTRAVENOUS | Status: AC
  Administered 2020-05-30: 150 mg via INTRAVENOUS
  Filled 2020-05-30: qty 150

## 2020-05-30 MED ORDER — VINBLASTINE SULFATE CHEMO INJECTION 1 MG/ML
6.1500 mg/m2 | Freq: Once | INTRAVENOUS | Status: AC
  Administered 2020-05-30: 12 mg via INTRAVENOUS
  Filled 2020-05-30: qty 12

## 2020-05-30 MED ORDER — SODIUM CHLORIDE 0.9 % IV SOLN
Freq: Once | INTRAVENOUS | Status: AC
  Filled 2020-05-30: qty 250

## 2020-05-30 MED ORDER — DOXORUBICIN HCL CHEMO IV INJECTION 2 MG/ML
25.0000 mg/m2 | Freq: Once | INTRAVENOUS | Status: AC
  Administered 2020-05-30: 48 mg via INTRAVENOUS
  Filled 2020-05-30: qty 24

## 2020-05-30 MED ORDER — SODIUM CHLORIDE 0.9 % IV SOLN
10.0000 [IU]/m2 | Freq: Once | INTRAVENOUS | Status: AC
  Administered 2020-05-30: 20 [IU] via INTRAVENOUS
  Filled 2020-05-30: qty 6.67

## 2020-05-30 NOTE — Patient Instructions (Signed)
[facility] Discharge Instructions for Patients Receiving Chemotherapy  Today you received the following chemotherapy agents: Adriamycin, Velban, Bleocin, DTIC  To help prevent nausea and vomiting after your treatment, we encourage you to take your nausea medication as directed.    If you develop nausea and vomiting that is not controlled by your nausea medication, call the clinic.   BELOW ARE SYMPTOMS THAT SHOULD BE REPORTED IMMEDIATELY:  *FEVER GREATER THAN 100.5 F  *CHILLS WITH OR WITHOUT FEVER  NAUSEA AND VOMITING THAT IS NOT CONTROLLED WITH YOUR NAUSEA MEDICATION  *UNUSUAL SHORTNESS OF BREATH  *UNUSUAL BRUISING OR BLEEDING  TENDERNESS IN MOUTH AND THROAT WITH OR WITHOUT PRESENCE OF ULCERS  *URINARY PROBLEMS  *BOWEL PROBLEMS  UNUSUAL RASH Items with * indicate a potential emergency and should be followed up as soon as possible.  Feel free to call the clinic should you have any questions or concerns. The clinic phone number is [phone].  Please show the [facility] at check-in to the Emergency Department and triage nurse.   Patient needs to receive COVID Vaccine due to current health condition. Vaccine was going to be administered today in clinic but currently out at this time. [place] Per Dr. Irene Limbo for Patient to receive Vaccination at facility.
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
## 2020-06-03 ENCOUNTER — Telehealth: Payer: Self-pay | Admitting: Hematology

## 2020-06-03 ENCOUNTER — Ambulatory Visit

## 2020-06-03 NOTE — Telephone Encounter (Signed)
Added MD visit to scheduled appointment.

## 2020-06-05 ENCOUNTER — Ambulatory Visit (INDEPENDENT_AMBULATORY_CARE_PROVIDER_SITE_OTHER): Admitting: Student

## 2020-06-05 ENCOUNTER — Encounter: Payer: Self-pay | Admitting: Student

## 2020-06-05 ENCOUNTER — Other Ambulatory Visit: Payer: Self-pay

## 2020-06-05 VITALS — BP 104/64 | HR 62 | Ht 74.0 in | Wt 165.0 lb

## 2020-06-05 DIAGNOSIS — C8190 Hodgkin lymphoma, unspecified, unspecified site: Secondary | ICD-10-CM | POA: Diagnosis not present

## 2020-06-05 DIAGNOSIS — J90 Pleural effusion, not elsewhere classified: Secondary | ICD-10-CM

## 2020-06-05 DIAGNOSIS — Z720 Tobacco use: Secondary | ICD-10-CM

## 2020-06-05 DIAGNOSIS — R0602 Shortness of breath: Secondary | ICD-10-CM | POA: Diagnosis not present

## 2020-06-05 DIAGNOSIS — I313 Pericardial effusion (noninflammatory): Secondary | ICD-10-CM

## 2020-06-05 DIAGNOSIS — R569 Unspecified convulsions: Secondary | ICD-10-CM

## 2020-06-05 DIAGNOSIS — I3139 Other pericardial effusion (noninflammatory): Secondary | ICD-10-CM

## 2020-06-05 NOTE — Patient Instructions (Signed)
Medication Instructions:  Continue current medications  *If you need a refill on your cardiac medications before your next appointment, please call your pharmacy*   Lab Work: None Ordered   Testing/Procedures: Patient need to have Chest Xray- Pleural Effusion   Follow-Up: At [facility], you and your health needs are our priority.  As part of our continuing mission to provide you with exceptional heart care, we have created designated Provider Care Teams.  These Care Teams include your primary Cardiologist (physician) and Advanced Practice Providers (APPs -  Physician Assistants and Nurse Practitioners) who all work together to provide you with the care you need, when you need it.  We recommend signing up for the patient portal called "MyChart".  Sign up information is provided on this After Visit Summary.  MyChart is used to connect with patients for Virtual Visits (Telemedicine).  Patients are able to view lab/test results, encounter notes, upcoming appointments, etc.  Non-urgent messages can be sent to your provider as well.   To learn more about what you can do with MyChart, go to [email or web address].    Your next appointment:   3 month(s)  The format for your next appointment:   In Person  Provider:   You may see Evalina Field, MD or one of the following Advanced Practice Providers on your designated Care Team:    Almyra Deforest, PA-C  Fabian Sharp, PA-C or   Roby Lofts, [place]    Other Instructions  You have been referred to Neurologist

## 2020-06-13 ENCOUNTER — Encounter: Payer: Self-pay | Admitting: Hematology
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
## 2020-06-13 NOTE — Progress Notes (Signed)
Created GFE(Good Faith Estimate) for patient review.

## 2020-06-14 ENCOUNTER — Ambulatory Visit (HOSPITAL_COMMUNITY): Payer: Self-pay | Attending: Cardiovascular Disease

## 2020-06-14 ENCOUNTER — Other Ambulatory Visit: Payer: Self-pay

## 2020-06-14 ENCOUNTER — Inpatient Hospital Stay: Payer: Self-pay

## 2020-06-14 ENCOUNTER — Other Ambulatory Visit: Payer: Self-pay | Admitting: Hematology

## 2020-06-14 ENCOUNTER — Inpatient Hospital Stay: Payer: Self-pay | Admitting: Hematology

## 2020-06-14 DIAGNOSIS — C8172 Other classical Hodgkin lymphoma, intrathoracic lymph nodes: Secondary | ICD-10-CM

## 2020-06-14 DIAGNOSIS — I3139 Other pericardial effusion (noninflammatory): Secondary | ICD-10-CM

## 2020-06-14 DIAGNOSIS — Z7189 Other specified counseling: Secondary | ICD-10-CM

## 2020-06-14 DIAGNOSIS — I313 Pericardial effusion (noninflammatory): Secondary | ICD-10-CM | POA: Insufficient documentation

## 2020-06-14 LAB — ECHOCARDIOGRAM LIMITED
Area-P 1/2: 2.42 cm2
S' Lateral: 3.4 cm

## 2020-06-14 LAB — ABO/RH: ABO/RH(D): AB POS

## 2020-06-19 ENCOUNTER — Other Ambulatory Visit: Payer: Self-pay | Admitting: Student

## 2020-06-20 ENCOUNTER — Encounter: Payer: Self-pay | Admitting: Hematology

## 2020-06-20 NOTE — Progress Notes (Signed)
Working from [employer] to create [employer]), name disappeared from [place] when reviewing treatment plan for [place]. Will attempt again if reappears.

## 2020-06-21 ENCOUNTER — Ambulatory Visit (HOSPITAL_COMMUNITY)
Admission: RE | Admit: 2020-06-21 | Discharge: 2020-06-21 | Disposition: A | Discharge status: Home / Self Care | Payer: Self-pay | Source: Ambulatory Visit | Attending: Hematology | Admitting: Hematology

## 2020-06-21 ENCOUNTER — Encounter (HOSPITAL_COMMUNITY): Payer: Self-pay

## 2020-06-21 ENCOUNTER — Other Ambulatory Visit: Payer: Self-pay

## 2020-06-21 DIAGNOSIS — C8172 Other classical Hodgkin lymphoma, intrathoracic lymph nodes: Secondary | ICD-10-CM

## 2020-06-21 DIAGNOSIS — C8112 Nodular sclerosis classical Hodgkin lymphoma, intrathoracic lymph nodes: Secondary | ICD-10-CM | POA: Insufficient documentation

## 2020-06-21 DIAGNOSIS — F17200 Nicotine dependence, unspecified, uncomplicated: Secondary | ICD-10-CM | POA: Insufficient documentation

## 2020-06-21 HISTORY — PX: IR IMAGING GUIDED PORT INSERTION: IMG5740

## 2020-06-21 MED ORDER — FENTANYL CITRATE (PF) 100 MCG/2ML IJ SOLN
INTRAMUSCULAR | Status: AC
  Filled 2020-06-21: qty 2

## 2020-06-21 MED ORDER — ACETAMINOPHEN 500 MG PO TABS
1000.0000 mg | ORAL_TABLET | ORAL | Status: AC
  Administered 2020-06-21: 1000 mg via ORAL
  Filled 2020-06-21: qty 2

## 2020-06-21 MED ORDER — MIDAZOLAM HCL 2 MG/2ML IJ SOLN
INTRAMUSCULAR | Status: AC
  Filled 2020-06-21: qty 2

## 2020-06-21 MED ORDER — HEPARIN SOD (PORK) LOCK FLUSH 100 UNIT/ML IV SOLN
INTRAVENOUS | Status: AC
  Filled 2020-06-21: qty 5

## 2020-06-21 MED ORDER — FENTANYL CITRATE (PF) 100 MCG/2ML IJ SOLN
INTRAMUSCULAR | Status: AC | PRN
  Administered 2020-06-21: 25 ug via INTRAVENOUS
  Administered 2020-06-21: 50 ug via INTRAVENOUS
  Administered 2020-06-21: 25 ug via INTRAVENOUS

## 2020-06-21 MED ORDER — MIDAZOLAM HCL 2 MG/2ML IJ SOLN
INTRAMUSCULAR | Status: AC | PRN
  Administered 2020-06-21 (×2): 0.5 mg via INTRAVENOUS
  Administered 2020-06-21: 1 mg via INTRAVENOUS

## 2020-06-21 MED ORDER — LIDOCAINE HCL 1 % IJ SOLN
INTRAMUSCULAR | Status: AC
  Filled 2020-06-21: qty 20

## 2020-06-21 MED ORDER — SODIUM CHLORIDE 0.9 % IV SOLN: INTRAVENOUS | Status: DC

## 2020-06-21 NOTE — Procedures (Signed)
Interventional Radiology Procedure Note  Procedure: Single Lumen Power Port Placement    Access:  Right IJ vein.  Findings: Catheter tip positioned at SVC/RA junction. Port is ready for immediate use.   Complications: None  EBL: < 10 mL  Recommendations:  - Ok to shower in 24 hours - Do not submerge for 7 days - Routine line care   Gregorio Worley T. Kindel Rochefort, M.D Pager:  [phone]   

## 2020-06-21 NOTE — Discharge Instructions (Signed)
Please call Interventional Radiology clinic [phone] with any questions or concerns.  You may remove your dressing and shower tomorrow.   Implanted Port Insertion, Care After This sheet gives you information about how to care for yourself after your procedure. Your health care provider may also give you more specific instructions. If you have problems or questions, contact your health care provider. What can I expect after the procedure? After the procedure, it is common to have:  Discomfort at the port insertion site.  Bruising on the skin over the port. This should improve over 3-4 days. Follow these instructions at home: Port care  After your port is placed, you will get a manufacturer's information card. The card has information about your port. Keep this card with you at all times.  Take care of the port as told by your health care provider. Ask your health care provider if you or a family member can get training for taking care of the port at home. A home health care nurse may also take care of the port.  Make sure to remember what type of port you have. Incision care 1. Follow instructions from your health care provider about how to take care of your port insertion site. Make sure you: ? Wash your hands with soap and water before and after you change your bandage (dressing). If soap and water are not available, use hand sanitizer. ? Change your dressing as told by your health care provider. ? Leave stitches (sutures), skin glue, or adhesive strips in place. These skin closures may need to stay in place for 2 weeks or longer. If adhesive strip edges start to loosen and curl up, you may trim the loose edges. Do not remove adhesive strips completely unless your health care provider tells you to do that. 2. Check your port insertion site every day for signs of infection. Check for: ? Redness, swelling, or pain. ? Fluid or blood. ? Warmth. ? Pus or a bad smell.         Activity  Return to your normal activities as told by your health care provider. Ask your health care provider what activities are safe for you.  Do not lift anything that is heavier than 10 lb (4.5 kg), or the limit that you are told, until your health care provider says that it is safe. General instructions  Take over-the-counter and prescription medicines only as told by your health care provider.  Do not take baths, swim, or use a hot tub until your health care provider approves. Ask your health care provider if you may take showers. You may only be allowed to take sponge baths.  Do not drive for 24 hours if you were given a sedative during your procedure.  Wear a medical alert bracelet in case of an emergency. This will tell any health care providers that you have a port.  Keep all follow-up visits as told by your health care provider. This is important. Contact a health care provider if:  You cannot flush your port with saline as directed, or you cannot draw blood from the port.  You have a fever or chills.  You have redness, swelling, or pain around your port insertion site.  You have fluid or blood coming from your port insertion site.  Your port insertion site feels warm to the touch.  You have pus or a bad smell coming from the port insertion site. Get help right away if:  You have chest pain or shortness   of breath.  You have bleeding from your port that you cannot control. Summary  Take care of the port as told by your health care provider. Keep the manufacturer's information card with you at all times.  Change your dressing as told by your health care provider.  Contact a health care provider if you have a fever or chills or if you have redness, swelling, or pain around your port insertion site.  Keep all follow-up visits as told by your health care provider. This information is not intended to replace advice given to you by your health care provider. Make  sure you discuss any questions you have with your health care provider. Document Revised: 08/31/2017 Document Reviewed: 08/31/2017 Elsevier Patient Education  2021 Elsevier Inc.   Moderate Conscious Sedation, Adult, Care After This sheet gives you information about how to care for yourself after your procedure. Your health care provider may also give you more specific instructions. If you have problems or questions, contact your health care provider. What can I expect after the procedure? After the procedure, it is common to have:  Sleepiness for several hours.  Impaired judgment for several hours.  Difficulty with balance.  Vomiting if you eat too soon. Follow these instructions at home: For the time period you were told by your health care provider:  Rest.  Do not participate in activities where you could fall or become injured.  Do not drive or use machinery.  Do not drink alcohol.  Do not take sleeping pills or medicines that cause drowsiness.  Do not make important decisions or sign legal documents.  Do not take care of children on your own.        Eating and drinking 3. Follow the diet recommended by your health care provider. 4. Drink enough fluid to keep your urine pale yellow. 5. If you vomit: ? Drink water, juice, or soup when you can drink without vomiting. ? Make sure you have little or no nausea before eating solid foods.    General instructions  Take over-the-counter and prescription medicines only as told by your health care provider.  Have a responsible adult stay with you for the time you are told. It is important to have someone help care for you until you are awake and alert.  Do not smoke.  Keep all follow-up visits as told by your health care provider. This is important. Contact a health care provider if:  You are still sleepy or having trouble with balance after 24 hours.  You feel light-headed.  You keep feeling nauseous or you keep  vomiting.  You develop a rash.  You have a fever.  You have redness or swelling around the IV site. Get help right away if:  You have trouble breathing.  You have new-onset confusion at home. Summary  After the procedure, it is common to feel sleepy, have impaired judgment, or feel nauseous if you eat too soon.  Rest after you get home. Know the things you should not do after the procedure.  Follow the diet recommended by your health care provider and drink enough fluid to keep your urine pale yellow.  Get help right away if you have trouble breathing or new-onset confusion at home. This information is not intended to replace advice given to you by your health care provider. Make sure you discuss any questions you have with your health care provider. Document Revised: 06/02/2019 Document Reviewed: 12/29/2018 Elsevier Patient Education  2021 Elsevier Inc. 
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
## 2020-06-28 ENCOUNTER — Other Ambulatory Visit: Payer: Self-pay

## 2020-06-28 ENCOUNTER — Inpatient Hospital Stay: Payer: Self-pay

## 2020-06-28 ENCOUNTER — Inpatient Hospital Stay (HOSPITAL_BASED_OUTPATIENT_CLINIC_OR_DEPARTMENT_OTHER): Payer: Self-pay | Admitting: Hematology

## 2020-06-28 ENCOUNTER — Inpatient Hospital Stay: Payer: Self-pay | Attending: Hematology

## 2020-06-28 VITALS — BP 116/68 | HR 60 | Temp 97.7°F | Resp 17

## 2020-06-28 DIAGNOSIS — C8172 Other classical Hodgkin lymphoma, intrathoracic lymph nodes: Secondary | ICD-10-CM

## 2020-06-28 DIAGNOSIS — C8192 Hodgkin lymphoma, unspecified, intrathoracic lymph nodes: Secondary | ICD-10-CM | POA: Insufficient documentation

## 2020-06-28 DIAGNOSIS — Z79899 Other long term (current) drug therapy: Secondary | ICD-10-CM | POA: Insufficient documentation

## 2020-06-28 DIAGNOSIS — Z5111 Encounter for antineoplastic chemotherapy: Secondary | ICD-10-CM

## 2020-06-28 DIAGNOSIS — Z7189 Other specified counseling: Secondary | ICD-10-CM

## 2020-06-28 DIAGNOSIS — Z95828 Presence of other vascular implants and grafts: Secondary | ICD-10-CM

## 2020-06-28 LAB — CBC WITH DIFFERENTIAL/PLATELET
Abs Immature Granulocytes: 0.04 10*3/uL (ref 0.00–0.07)
Basophils Absolute: 0.1 10*3/uL (ref 0.0–0.1)
Basophils Relative: 1 %
Eosinophils Absolute: 0.1 10*3/uL (ref 0.0–0.5)
Eosinophils Relative: 1 %
HCT: 40.4 % (ref 39.0–52.0)
Hemoglobin: 12.6 g/dL — ABNORMAL LOW (ref 13.0–17.0)
Immature Granulocytes: 0 %
Lymphocytes Relative: 22 %
Lymphs Abs: 2 10*3/uL (ref 0.7–4.0)
MCH: 25.8 pg — ABNORMAL LOW (ref 26.0–34.0)
MCHC: 31.2 g/dL (ref 30.0–36.0)
MCV: 82.6 fL (ref 80.0–100.0)
Monocytes Absolute: 1.2 10*3/uL — ABNORMAL HIGH (ref 0.1–1.0)
Monocytes Relative: 13 %
Neutro Abs: 5.6 10*3/uL (ref 1.7–7.7)
Neutrophils Relative %: 63 %
Platelets: 246 10*3/uL (ref 150–400)
RBC: 4.89 MIL/uL (ref 4.22–5.81)
RDW: 20.2 % — ABNORMAL HIGH (ref 11.5–15.5)
WBC: 9 10*3/uL (ref 4.0–10.5)
nRBC: 0 % (ref 0.0–0.2)

## 2020-06-28 LAB — CMP (CANCER CENTER ONLY)
ALT: 19 U/L (ref 0–44)
AST: 17 U/L (ref 15–41)
Albumin: 3.8 g/dL (ref 3.5–5.0)
Alkaline Phosphatase: 52 U/L (ref 38–126)
Anion gap: 7 (ref 5–15)
BUN: 7 mg/dL (ref 6–20)
CO2: 26 mmol/L (ref 22–32)
Calcium: 9.5 mg/dL (ref 8.9–10.3)
Chloride: 105 mmol/L (ref 98–111)
Creatinine: 0.81 mg/dL (ref 0.61–1.24)
GFR, Estimated: >60 mL/min (ref >60)
Glucose, Bld: 83 mg/dL (ref 70–99)
Potassium: 3.8 mmol/L (ref 3.5–5.1)
Sodium: 138 mmol/L (ref 135–145)
Total Bilirubin: 0.4 mg/dL (ref 0.3–1.2)
Total Protein: 7.2 g/dL (ref 6.5–8.1)

## 2020-06-28 LAB — SEDIMENTATION RATE: Sed Rate: 23 mm/hr — ABNORMAL HIGH (ref 0–16)

## 2020-06-28 LAB — SAMPLE TO BLOOD BANK

## 2020-06-28 MED ORDER — SODIUM CHLORIDE 0.9% FLUSH
10.0000 mL | INTRAVENOUS | Status: DC | PRN
  Administered 2020-06-28: 10 mL
  Filled 2020-06-28: qty 10

## 2020-06-28 MED ORDER — ACETAMINOPHEN 325 MG PO TABS
ORAL_TABLET | ORAL | Status: AC
  Filled 2020-06-28: qty 2

## 2020-06-28 MED ORDER — PALONOSETRON HCL INJECTION 0.25 MG/5ML
0.2500 mg | Freq: Once | INTRAVENOUS | Status: AC
Start: 2020-06-28 — End: 2020-06-28
  Administered 2020-06-28: 0.25 mg via INTRAVENOUS

## 2020-06-28 MED ORDER — SODIUM CHLORIDE 0.9 % IV SOLN
10.0000 mg | Freq: Once | INTRAVENOUS | Status: AC
  Administered 2020-06-28: 10 mg via INTRAVENOUS
  Filled 2020-06-28: qty 10

## 2020-06-28 MED ORDER — SODIUM CHLORIDE 0.9% FLUSH
10.0000 mL | Freq: Once | INTRAVENOUS | Status: AC
Start: 2020-06-28 — End: 2020-06-28
  Administered 2020-06-28: 10 mL
  Filled 2020-06-28: qty 10

## 2020-06-28 MED ORDER — DOXORUBICIN HCL CHEMO IV INJECTION 2 MG/ML
25.0000 mg/m2 | Freq: Once | INTRAVENOUS | Status: AC
Start: 2020-06-28 — End: 2020-06-28
  Administered 2020-06-28: 48 mg via INTRAVENOUS
  Filled 2020-06-28: qty 24

## 2020-06-28 MED ORDER — SODIUM CHLORIDE 0.9 % IV SOLN
6.1500 mg/m2 | Freq: Once | INTRAVENOUS | Status: AC
  Administered 2020-06-28: 12 mg via INTRAVENOUS
  Filled 2020-06-28: qty 12

## 2020-06-28 MED ORDER — SODIUM CHLORIDE 0.9 % IV SOLN
375.0000 mg/m2 | Freq: Once | INTRAVENOUS | Status: AC
  Administered 2020-06-28: 730 mg via INTRAVENOUS
  Filled 2020-06-28: qty 73

## 2020-06-28 MED ORDER — SODIUM CHLORIDE 0.9 % IV SOLN
150.0000 mg | Freq: Once | INTRAVENOUS | Status: AC
  Administered 2020-06-28: 150 mg via INTRAVENOUS
  Filled 2020-06-28: qty 150

## 2020-06-28 MED ORDER — HEPARIN SOD (PORK) LOCK FLUSH 100 UNIT/ML IV SOLN
500.0000 [IU] | Freq: Once | INTRAVENOUS | Status: AC | PRN
  Administered 2020-06-28: 500 [IU]
  Filled 2020-06-28: qty 5

## 2020-06-28 MED ORDER — SODIUM CHLORIDE 0.9 % IV SOLN
Freq: Once | INTRAVENOUS | Status: AC
Start: 2020-06-28 — End: 2020-06-28
  Filled 2020-06-28: qty 250

## 2020-06-28 MED ORDER — PALONOSETRON HCL INJECTION 0.25 MG/5ML
INTRAVENOUS | Status: AC
  Filled 2020-06-28: qty 5

## 2020-06-28 MED ORDER — SODIUM CHLORIDE 0.9 % IV SOLN
10.0000 [IU]/m2 | Freq: Once | INTRAVENOUS | Status: AC
  Administered 2020-06-28: 20 [IU] via INTRAVENOUS
  Filled 2020-06-28: qty 6.67

## 2020-06-28 MED ORDER — ACETAMINOPHEN 325 MG PO TABS
650.0000 mg | ORAL_TABLET | Freq: Once | ORAL | Status: AC
  Administered 2020-06-28: 650 mg via ORAL

## 2020-06-28 NOTE — Patient Instructions (Signed)

## 2020-06-28 NOTE — Patient Instructions (Signed)
[facility]  Discharge Instructions: Thank you for choosing [facility] to provide your oncology and hematology care.   If you have a lab appointment with the [facility], please go directly to the [facility] and check in at the registration area.   Wear comfortable clothing and clothing appropriate for easy access to any Portacath or PICC line.   We strive to give you quality time with your provider. You may need to reschedule your appointment if you arrive late (15 or more minutes).  Arriving late affects you and other patients whose appointments are after yours.  Also, if you miss three or more appointments without notifying the office, you may be dismissed from the clinic at the provider's discretion.      For prescription refill requests, have your pharmacy contact our office and allow 72 hours for refills to be completed.    Today you received the following chemotherapy and/or immunotherapy agents adriamycin, vinblastine, bleomycin, dacarbazine.     To help prevent nausea and vomiting after your treatment, we encourage you to take your nausea medication as directed.  BELOW ARE SYMPTOMS THAT SHOULD BE REPORTED IMMEDIATELY: . *FEVER GREATER THAN 100.4 F (38 C) OR HIGHER . *CHILLS OR SWEATING . *NAUSEA AND VOMITING THAT IS NOT CONTROLLED WITH YOUR NAUSEA MEDICATION . *UNUSUAL SHORTNESS OF BREATH . *UNUSUAL BRUISING OR BLEEDING . *URINARY PROBLEMS (pain or burning when urinating, or frequent urination) . *BOWEL PROBLEMS (unusual diarrhea, constipation, pain near the anus) . TENDERNESS IN MOUTH AND THROAT WITH OR WITHOUT PRESENCE OF ULCERS (sore throat, sores in mouth, or a toothache) . UNUSUAL RASH, SWELLING OR PAIN  . UNUSUAL VAGINAL DISCHARGE OR ITCHING   Items with * indicate a potential emergency and should be followed up as soon as possible or go to the Emergency Department if any problems should occur.  Please show the  CHEMOTHERAPY ALERT CARD or IMMUNOTHERAPY ALERT CARD at check-in to the Emergency Department and triage nurse.  Should you have questions after your visit or need to cancel or reschedule your appointment, please contact [facility]  Dept: [phone]  and follow the prompts.  Office hours are 8:00 a.m. to 4:30 p.m. Monday - Friday. Please note that voicemails left after 4:00 p.m. may not be returned until the following business day.  We are closed weekends and major holidays. You have access to a nurse at all times for urgent questions. Please call the main number to the clinic Dept: [phone] and follow the prompts.   For any non-urgent questions, you may also contact your provider using MyChart. We now offer e-Visits for anyone 83 and older to request care online for non-urgent symptoms. For details visit [email or web address].   Also download the MyChart app! Go to the app store, search "MyChart", open the app, select [facility], and log in with your MyChart username and password.  Due to Covid, a mask is required upon entering the hospital/clinic. If you do not have a mask, one will be given to you upon arrival. For doctor visits, patients may have 1 support person aged 76 or older with them. For treatment visits, patients cannot have anyone with them due to current Covid guidelines and our immunocompromised population.

## 2020-07-11 ENCOUNTER — Encounter: Payer: Self-pay | Admitting: Hematology
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
## 2020-07-12 ENCOUNTER — Inpatient Hospital Stay: Payer: Self-pay

## 2020-07-12 ENCOUNTER — Inpatient Hospital Stay (HOSPITAL_BASED_OUTPATIENT_CLINIC_OR_DEPARTMENT_OTHER): Payer: Self-pay | Admitting: Hematology

## 2020-07-12 ENCOUNTER — Other Ambulatory Visit: Payer: Self-pay

## 2020-07-12 VITALS — BP 122/76 | HR 60 | Temp 98.2°F | Resp 16

## 2020-07-12 DIAGNOSIS — C8172 Other classical Hodgkin lymphoma, intrathoracic lymph nodes: Secondary | ICD-10-CM

## 2020-07-12 DIAGNOSIS — Z7189 Other specified counseling: Secondary | ICD-10-CM

## 2020-07-12 LAB — CBC WITH DIFFERENTIAL/PLATELET
Abs Immature Granulocytes: 0.01 10*3/uL (ref 0.00–0.07)
Basophils Absolute: 0 10*3/uL (ref 0.0–0.1)
Basophils Relative: 1 %
Eosinophils Absolute: 0.1 10*3/uL (ref 0.0–0.5)
Eosinophils Relative: 3 %
HCT: 42.9 % (ref 39.0–52.0)
Hemoglobin: 13.4 g/dL (ref 13.0–17.0)
Immature Granulocytes: 0 %
Lymphocytes Relative: 40 %
Lymphs Abs: 1.4 10*3/uL (ref 0.7–4.0)
MCH: 26 pg (ref 26.0–34.0)
MCHC: 31.2 g/dL (ref 30.0–36.0)
MCV: 83.1 fL (ref 80.0–100.0)
Monocytes Absolute: 0.4 10*3/uL (ref 0.1–1.0)
Monocytes Relative: 10 %
Neutro Abs: 1.6 10*3/uL — ABNORMAL LOW (ref 1.7–7.7)
Neutrophils Relative %: 46 %
Platelets: 205 10*3/uL (ref 150–400)
RBC: 5.16 MIL/uL (ref 4.22–5.81)
RDW: 18.6 % — ABNORMAL HIGH (ref 11.5–15.5)
WBC: 3.5 10*3/uL — ABNORMAL LOW (ref 4.0–10.5)
nRBC: 0 % (ref 0.0–0.2)

## 2020-07-12 LAB — CMP (CANCER CENTER ONLY)
ALT: 21 U/L (ref 0–44)
AST: 20 U/L (ref 15–41)
Albumin: 3.9 g/dL (ref 3.5–5.0)
Alkaline Phosphatase: 49 U/L (ref 38–126)
Anion gap: 12 (ref 5–15)
BUN: 7 mg/dL (ref 6–20)
CO2: 24 mmol/L (ref 22–32)
Calcium: 9.5 mg/dL (ref 8.9–10.3)
Chloride: 105 mmol/L (ref 98–111)
Creatinine: 0.81 mg/dL (ref 0.61–1.24)
GFR, Estimated: >60 mL/min (ref >60)
Glucose, Bld: 88 mg/dL (ref 70–99)
Potassium: 3.9 mmol/L (ref 3.5–5.1)
Sodium: 141 mmol/L (ref 135–145)
Total Bilirubin: 0.5 mg/dL (ref 0.3–1.2)
Total Protein: 7.5 g/dL (ref 6.5–8.1)

## 2020-07-12 LAB — SEDIMENTATION RATE: Sed Rate: 10 mm/hr (ref 0–16)

## 2020-07-12 LAB — SAMPLE TO BLOOD BANK

## 2020-07-12 MED ORDER — SODIUM CHLORIDE 0.9 % IV SOLN
10.0000 [IU]/m2 | Freq: Once | INTRAVENOUS | Status: DC
  Filled 2020-07-12: qty 6.7

## 2020-07-12 MED ORDER — SODIUM CHLORIDE 0.9 % IV SOLN
Freq: Once | INTRAVENOUS | Status: AC
  Filled 2020-07-12: qty 250

## 2020-07-12 MED ORDER — VINBLASTINE SULFATE CHEMO INJECTION 1 MG/ML
6.1500 mg/m2 | Freq: Once | INTRAVENOUS | Status: DC
  Filled 2020-07-12: qty 12

## 2020-07-12 MED ORDER — SODIUM CHLORIDE 0.9 % IV SOLN
10.0000 mg | Freq: Once | INTRAVENOUS | Status: AC
  Administered 2020-07-12: 10 mg via INTRAVENOUS
  Filled 2020-07-12: qty 10

## 2020-07-12 MED ORDER — PALONOSETRON HCL INJECTION 0.25 MG/5ML
0.2500 mg | Freq: Once | INTRAVENOUS | Status: AC
  Administered 2020-07-12: 0.25 mg via INTRAVENOUS

## 2020-07-12 MED ORDER — DOXORUBICIN HCL CHEMO IV INJECTION 2 MG/ML
25.0000 mg/m2 | Freq: Once | INTRAVENOUS | Status: AC
  Administered 2020-07-12: 48 mg via INTRAVENOUS
  Filled 2020-07-12: qty 24

## 2020-07-12 MED ORDER — SODIUM CHLORIDE 0.9% FLUSH
10.0000 mL | INTRAVENOUS | Status: DC | PRN
  Administered 2020-07-12: 10 mL
  Filled 2020-07-12: qty 10

## 2020-07-12 MED ORDER — SODIUM CHLORIDE 0.9 % IV SOLN
150.0000 mg | Freq: Once | INTRAVENOUS | Status: AC
  Administered 2020-07-12: 150 mg via INTRAVENOUS
  Filled 2020-07-12: qty 150

## 2020-07-12 MED ORDER — SODIUM CHLORIDE 0.9 % IV SOLN
375.0000 mg/m2 | Freq: Once | INTRAVENOUS | Status: DC
  Filled 2020-07-12: qty 73

## 2020-07-12 MED ORDER — PALONOSETRON HCL INJECTION 0.25 MG/5ML
INTRAVENOUS | Status: AC
  Filled 2020-07-12: qty 5

## 2020-07-12 MED ORDER — HEPARIN SOD (PORK) LOCK FLUSH 100 UNIT/ML IV SOLN
500.0000 [IU] | Freq: Once | INTRAVENOUS | Status: AC | PRN
  Administered 2020-07-12: 500 [IU]
  Filled 2020-07-12: qty 5

## 2020-07-12 NOTE — Progress Notes (Signed)
Patient presented today for AVBD infusion treatment. Patient's port was accessed without complications, Vital signs were stable and pt agreeable to infusion. Patient received Aloxi, Decadron, and Emend as premeds for infusion and tolerated those fine. Patient then was to start AVBD regimen, starting with adriamycin, to which he was agreeable. Prior to pushing the adriamycin this RN checked for blood return on the port, which was satisfactory, then approximately 73mls was pushed through patient's port over the span of about 30 seconds, followed by NS. Patient then began to violently shake in the bed, and started to voice his pain/discomfort. The adriamycin was stopped, NS was flushed through the port and blood return was present. Patient stated that the adriamycin was "burning his chest," at the port site, NS was flushed through the line, the burning stopped per the patient, the skin at the port site was [facility] and patient was agreeable to try again. This RN tried to push the adriamycin again, this time slower and patient had the same shaking reaction as well as saying he was going to start "dry heaving." This RN then disconnected the adriamycin and let NS run to gravity (patient received approximately 437mls over the span of 20 minutes.) This RN spoke with Dr.Kale and he recommended to restart the adriamycin, if patient was agreeable, after a NS bolus. The patient declined to have any further treatment today after the bolus of NS, MD was made aware. Patient was then de-accessed, given appropriate paperwork, and discharged in stable condition in a wheelchair to lobby with the guards that brought him today.

## 2020-07-12 NOTE — Patient Instructions (Signed)

## 2020-07-17 ENCOUNTER — Encounter: Payer: Self-pay | Admitting: Hematology

## 2020-07-18 ENCOUNTER — Encounter: Payer: Self-pay | Admitting: Hematology

## 2020-07-19 ENCOUNTER — Encounter: Payer: Self-pay | Admitting: Hematology

## 2020-07-26 ENCOUNTER — Other Ambulatory Visit: Payer: Self-pay

## 2020-07-26 ENCOUNTER — Inpatient Hospital Stay: Attending: Hematology

## 2020-07-26 ENCOUNTER — Inpatient Hospital Stay

## 2020-07-26 ENCOUNTER — Inpatient Hospital Stay (HOSPITAL_BASED_OUTPATIENT_CLINIC_OR_DEPARTMENT_OTHER): Admitting: Hematology and Oncology

## 2020-07-26 VITALS — BP 109/65 | HR 75 | Temp 97.3°F | Resp 17 | Wt 180.0 lb

## 2020-07-26 DIAGNOSIS — C8192 Hodgkin lymphoma, unspecified, intrathoracic lymph nodes: Secondary | ICD-10-CM | POA: Diagnosis present

## 2020-07-26 DIAGNOSIS — Z79899 Other long term (current) drug therapy: Secondary | ICD-10-CM | POA: Diagnosis not present

## 2020-07-26 DIAGNOSIS — Z95828 Presence of other vascular implants and grafts: Secondary | ICD-10-CM | POA: Diagnosis not present

## 2020-07-26 DIAGNOSIS — C8172 Other classical Hodgkin lymphoma, intrathoracic lymph nodes: Secondary | ICD-10-CM

## 2020-07-26 DIAGNOSIS — Z7189 Other specified counseling: Secondary | ICD-10-CM

## 2020-07-26 DIAGNOSIS — Z5111 Encounter for antineoplastic chemotherapy: Secondary | ICD-10-CM | POA: Diagnosis not present

## 2020-07-26 DIAGNOSIS — J90 Pleural effusion, not elsewhere classified: Secondary | ICD-10-CM | POA: Insufficient documentation

## 2020-07-26 LAB — CMP (CANCER CENTER ONLY)
ALT: 18 U/L (ref 0–44)
AST: 18 U/L (ref 15–41)
Albumin: 3.8 g/dL (ref 3.5–5.0)
Alkaline Phosphatase: 53 U/L (ref 38–126)
Anion gap: 9 (ref 5–15)
BUN: 10 mg/dL (ref 6–20)
CO2: 23 mmol/L (ref 22–32)
Calcium: 9.6 mg/dL (ref 8.9–10.3)
Chloride: 107 mmol/L (ref 98–111)
Creatinine: 0.84 mg/dL (ref 0.61–1.24)
GFR, Estimated: >60 mL/min (ref >60)
Glucose, Bld: 89 mg/dL (ref 70–99)
Potassium: 4.3 mmol/L (ref 3.5–5.1)
Sodium: 139 mmol/L (ref 135–145)
Total Bilirubin: 0.4 mg/dL (ref 0.3–1.2)
Total Protein: 7.8 g/dL (ref 6.5–8.1)

## 2020-07-26 LAB — CBC WITH DIFFERENTIAL/PLATELET
Abs Immature Granulocytes: 0.01 10*3/uL (ref 0.00–0.07)
Basophils Absolute: 0 10*3/uL (ref 0.0–0.1)
Basophils Relative: 1 %
Eosinophils Absolute: 0.2 10*3/uL (ref 0.0–0.5)
Eosinophils Relative: 3 %
HCT: 43.1 % (ref 39.0–52.0)
Hemoglobin: 14 g/dL (ref 13.0–17.0)
Immature Granulocytes: 0 %
Lymphocytes Relative: 20 %
Lymphs Abs: 1.2 10*3/uL (ref 0.7–4.0)
MCH: 26.8 pg (ref 26.0–34.0)
MCHC: 32.5 g/dL (ref 30.0–36.0)
MCV: 82.4 fL (ref 80.0–100.0)
Monocytes Absolute: 0.9 10*3/uL (ref 0.1–1.0)
Monocytes Relative: 16 %
Neutro Abs: 3.6 10*3/uL (ref 1.7–7.7)
Neutrophils Relative %: 60 %
Platelets: 225 10*3/uL (ref 150–400)
RBC: 5.23 MIL/uL (ref 4.22–5.81)
RDW: 17.5 % — ABNORMAL HIGH (ref 11.5–15.5)
WBC: 6 10*3/uL (ref 4.0–10.5)
nRBC: 0 % (ref 0.0–0.2)

## 2020-07-26 LAB — SAMPLE TO BLOOD BANK

## 2020-07-26 LAB — SEDIMENTATION RATE: Sed Rate: 25 mm/hr — ABNORMAL HIGH (ref 0–16)

## 2020-07-26 MED ORDER — SODIUM CHLORIDE 0.9% FLUSH
10.0000 mL | INTRAVENOUS | Status: DC | PRN
  Administered 2020-07-26: 10 mL
  Filled 2020-07-26: qty 10

## 2020-07-26 MED ORDER — PALONOSETRON HCL INJECTION 0.25 MG/5ML
0.2500 mg | Freq: Once | INTRAVENOUS | Status: AC
  Administered 2020-07-26: 0.25 mg via INTRAVENOUS

## 2020-07-26 MED ORDER — SODIUM CHLORIDE 0.9 % IV SOLN
375.0000 mg/m2 | Freq: Once | INTRAVENOUS | Status: AC
  Administered 2020-07-26: 730 mg via INTRAVENOUS
  Filled 2020-07-26: qty 73

## 2020-07-26 MED ORDER — SODIUM CHLORIDE 0.9 % IV SOLN
10.0000 mg | Freq: Once | INTRAVENOUS | Status: AC
  Administered 2020-07-26: 10 mg via INTRAVENOUS
  Filled 2020-07-26: qty 10

## 2020-07-26 MED ORDER — SODIUM CHLORIDE 0.9 % IV SOLN
Freq: Once | INTRAVENOUS | Status: AC
  Filled 2020-07-26: qty 250

## 2020-07-26 MED ORDER — SODIUM CHLORIDE 0.9 % IV SOLN
10.0000 [IU]/m2 | Freq: Once | INTRAVENOUS | Status: AC
  Administered 2020-07-26: 20 [IU] via INTRAVENOUS
  Filled 2020-07-26: qty 6.67

## 2020-07-26 MED ORDER — HEPARIN SOD (PORK) LOCK FLUSH 100 UNIT/ML IV SOLN
500.0000 [IU] | Freq: Once | INTRAVENOUS | Status: AC | PRN
Start: 2020-07-26 — End: 2020-07-26
  Administered 2020-07-26: 500 [IU]
  Filled 2020-07-26: qty 5

## 2020-07-26 MED ORDER — SODIUM CHLORIDE 0.9 % IV SOLN
6.1500 mg/m2 | Freq: Once | INTRAVENOUS | Status: AC
  Administered 2020-07-26: 12 mg via INTRAVENOUS
  Filled 2020-07-26: qty 12

## 2020-07-26 MED ORDER — PALONOSETRON HCL INJECTION 0.25 MG/5ML
INTRAVENOUS | Status: AC
  Filled 2020-07-26: qty 5

## 2020-07-26 MED ORDER — SODIUM CHLORIDE 0.9 % IV SOLN
150.0000 mg | Freq: Once | INTRAVENOUS | Status: AC
  Administered 2020-07-26: 150 mg via INTRAVENOUS
  Filled 2020-07-26: qty 150

## 2020-07-26 MED ORDER — DOXORUBICIN HCL CHEMO IV INJECTION 2 MG/ML
25.0000 mg/m2 | Freq: Once | INTRAVENOUS | Status: AC
  Administered 2020-07-26: 48 mg via INTRAVENOUS
  Filled 2020-07-26: qty 24
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
## 2020-07-26 NOTE — Patient Instructions (Signed)
[facility] ONCOLOGY  Discharge Instructions: Thank you for choosing [facility] to provide your oncology and hematology care.   If you have a lab appointment with the [facility], please go directly to the [facility] and check in at the registration area.   Wear comfortable clothing and clothing appropriate for easy access to any Portacath or PICC line.   We strive to give you quality time with your provider. You may need to reschedule your appointment if you arrive late (15 or more minutes).  Arriving late affects you and other patients whose appointments are after yours.  Also, if you miss three or more appointments without notifying the office, you may be dismissed from the clinic at the provider's discretion.      For prescription refill requests, have your pharmacy contact our office and allow 72 hours for refills to be completed.    Today you received the following chemotherapy and/or immunotherapy agents doxorubicin, vinblastine, bleomycin, and dacarbazine.      To help prevent nausea and vomiting after your treatment, we encourage you to take your nausea medication as directed.  BELOW ARE SYMPTOMS THAT SHOULD BE REPORTED IMMEDIATELY: *FEVER GREATER THAN 100.4 F (38 C) OR HIGHER *CHILLS OR SWEATING *NAUSEA AND VOMITING THAT IS NOT CONTROLLED WITH YOUR NAUSEA MEDICATION *UNUSUAL SHORTNESS OF BREATH *UNUSUAL BRUISING OR BLEEDING *URINARY PROBLEMS (pain or burning when urinating, or frequent urination) *BOWEL PROBLEMS (unusual diarrhea, constipation, pain near the anus) TENDERNESS IN MOUTH AND THROAT WITH OR WITHOUT PRESENCE OF ULCERS (sore throat, sores in mouth, or a toothache) UNUSUAL RASH, SWELLING OR PAIN  UNUSUAL VAGINAL DISCHARGE OR ITCHING   Items with * indicate a potential emergency and should be followed up as soon as possible or go to the Emergency Department if any problems should occur.  Please show the CHEMOTHERAPY ALERT CARD  or IMMUNOTHERAPY ALERT CARD at check-in to the Emergency Department and triage nurse.  Should you have questions after your visit or need to cancel or reschedule your appointment, please contact [facility]  Dept: [phone]  and follow the prompts.  Office hours are 8:00 a.m. to 4:30 p.m. Monday - Friday. Please note that voicemails left after 4:00 p.m. may not be returned until the following business day.  We are closed weekends and major holidays. You have access to a nurse at all times for urgent questions. Please call the main number to the clinic Dept: [phone] and follow the prompts.   For any non-urgent questions, you may also contact your provider using MyChart. We now offer e-Visits for anyone 8 and older to request care online for non-urgent symptoms. For details visit [email or web address].   Also download the MyChart app! Go to the app store, search "MyChart", open the app, select [facility], and log in with your MyChart username and password.  Due to Covid, a mask is required upon entering the hospital/clinic. If you do not have a mask, one will be given to you upon arrival. For doctor visits, patients may have 1 support person aged 20 or older with them. For treatment visits, patients cannot have anyone with them due to current Covid guidelines and our immunocompromised population.

## 2020-07-30 ENCOUNTER — Encounter: Payer: Self-pay | Admitting: Hematology

## 2020-07-31 ENCOUNTER — Telehealth: Payer: Self-pay | Admitting: Physician Assistant

## 2020-07-31 NOTE — Telephone Encounter (Signed)
Sch per 6/15 sch msg, pt aware 

## 2020-08-07 ENCOUNTER — Telehealth: Payer: Self-pay | Admitting: Physician Assistant

## 2020-08-07 NOTE — Telephone Encounter (Signed)
Sch per 6/15 sch msg, pt aware 

## 2020-08-08 ENCOUNTER — Encounter: Payer: Self-pay | Admitting: Hematology

## 2020-08-08 ENCOUNTER — Other Ambulatory Visit: Payer: Self-pay | Admitting: Physician Assistant

## 2020-08-08 DIAGNOSIS — C8172 Other classical Hodgkin lymphoma, intrathoracic lymph nodes: Secondary | ICD-10-CM

## 2020-08-09 ENCOUNTER — Other Ambulatory Visit

## 2020-08-09 ENCOUNTER — Ambulatory Visit

## 2020-08-09 ENCOUNTER — Inpatient Hospital Stay

## 2020-08-09 ENCOUNTER — Other Ambulatory Visit: Payer: Self-pay

## 2020-08-09 ENCOUNTER — Inpatient Hospital Stay (HOSPITAL_BASED_OUTPATIENT_CLINIC_OR_DEPARTMENT_OTHER): Admitting: Physician Assistant

## 2020-08-09 ENCOUNTER — Ambulatory Visit: Admitting: Physician Assistant

## 2020-08-09 VITALS — BP 129/82 | HR 64 | Temp 97.8°F | Resp 18

## 2020-08-09 DIAGNOSIS — Z7189 Other specified counseling: Secondary | ICD-10-CM

## 2020-08-09 DIAGNOSIS — C8172 Other classical Hodgkin lymphoma, intrathoracic lymph nodes: Secondary | ICD-10-CM

## 2020-08-09 DIAGNOSIS — C8100 Nodular lymphocyte predominant Hodgkin lymphoma, unspecified site: Secondary | ICD-10-CM

## 2020-08-09 DIAGNOSIS — Z5111 Encounter for antineoplastic chemotherapy: Secondary | ICD-10-CM | POA: Diagnosis not present

## 2020-08-09 LAB — CBC WITH DIFFERENTIAL/PLATELET
Abs Immature Granulocytes: 0 10*3/uL (ref 0.00–0.07)
Basophils Absolute: 0 10*3/uL (ref 0.0–0.1)
Basophils Relative: 1 %
Eosinophils Absolute: 0.2 10*3/uL (ref 0.0–0.5)
Eosinophils Relative: 5 %
HCT: 40.2 % (ref 39.0–52.0)
Hemoglobin: 12.9 g/dL — ABNORMAL LOW (ref 13.0–17.0)
Immature Granulocytes: 0 %
Lymphocytes Relative: 42 %
Lymphs Abs: 1.3 10*3/uL (ref 0.7–4.0)
MCH: 27 pg (ref 26.0–34.0)
MCHC: 32.1 g/dL (ref 30.0–36.0)
MCV: 84.1 fL (ref 80.0–100.0)
Monocytes Absolute: 0.5 10*3/uL (ref 0.1–1.0)
Monocytes Relative: 15 %
Neutro Abs: 1.1 10*3/uL — ABNORMAL LOW (ref 1.7–7.7)
Neutrophils Relative %: 37 %
Platelets: 218 10*3/uL (ref 150–400)
RBC: 4.78 MIL/uL (ref 4.22–5.81)
RDW: 15.9 % — ABNORMAL HIGH (ref 11.5–15.5)
WBC: 3.1 10*3/uL — ABNORMAL LOW (ref 4.0–10.5)
nRBC: 0 % (ref 0.0–0.2)

## 2020-08-09 LAB — SAMPLE TO BLOOD BANK

## 2020-08-09 LAB — CMP (CANCER CENTER ONLY)
ALT: 14 U/L (ref 0–44)
AST: 15 U/L (ref 15–41)
Albumin: 3.6 g/dL (ref 3.5–5.0)
Alkaline Phosphatase: 42 U/L (ref 38–126)
Anion gap: 8 (ref 5–15)
BUN: 9 mg/dL (ref 6–20)
CO2: 25 mmol/L (ref 22–32)
Calcium: 9.2 mg/dL (ref 8.9–10.3)
Chloride: 107 mmol/L (ref 98–111)
Creatinine: 0.79 mg/dL (ref 0.61–1.24)
GFR, Estimated: >60 mL/min (ref >60)
Glucose, Bld: 90 mg/dL (ref 70–99)
Potassium: 3.9 mmol/L (ref 3.5–5.1)
Sodium: 140 mmol/L (ref 135–145)
Total Bilirubin: 0.4 mg/dL (ref 0.3–1.2)
Total Protein: 7 g/dL (ref 6.5–8.1)

## 2020-08-09 LAB — SEDIMENTATION RATE: Sed Rate: 12 mm/hr (ref 0–16)

## 2020-08-09 MED ORDER — SODIUM CHLORIDE 0.9 % IV SOLN
150.0000 mg | Freq: Once | INTRAVENOUS | Status: AC
  Administered 2020-08-09: 150 mg via INTRAVENOUS
  Filled 2020-08-09: qty 150

## 2020-08-09 MED ORDER — VINBLASTINE SULFATE CHEMO INJECTION 1 MG/ML
6.1500 mg/m2 | Freq: Once | INTRAVENOUS | Status: AC
  Administered 2020-08-09: 12 mg via INTRAVENOUS
  Filled 2020-08-09: qty 12

## 2020-08-09 MED ORDER — SODIUM CHLORIDE 0.9 % IV SOLN
10.0000 [IU]/m2 | Freq: Once | INTRAVENOUS | Status: AC
  Administered 2020-08-09: 20 [IU] via INTRAVENOUS
  Filled 2020-08-09: qty 6.67

## 2020-08-09 MED ORDER — DEXAMETHASONE SODIUM PHOSPHATE 100 MG/10ML IJ SOLN
10.0000 mg | Freq: Once | INTRAMUSCULAR | Status: AC
  Administered 2020-08-09: 10 mg via INTRAVENOUS
  Filled 2020-08-09: qty 10

## 2020-08-09 MED ORDER — DOXORUBICIN HCL CHEMO IV INJECTION 2 MG/ML
25.0000 mg/m2 | Freq: Once | INTRAVENOUS | Status: AC
  Administered 2020-08-09: 48 mg via INTRAVENOUS
  Filled 2020-08-09: qty 24

## 2020-08-09 MED ORDER — SODIUM CHLORIDE 0.9% FLUSH
10.0000 mL | INTRAVENOUS | Status: DC | PRN
  Administered 2020-08-09: 10 mL
  Filled 2020-08-09: qty 10

## 2020-08-09 MED ORDER — PALONOSETRON HCL INJECTION 0.25 MG/5ML
INTRAVENOUS | Status: AC
  Filled 2020-08-09: qty 5

## 2020-08-09 MED ORDER — SODIUM CHLORIDE 0.9 % IV SOLN
375.0000 mg/m2 | Freq: Once | INTRAVENOUS | Status: AC
  Administered 2020-08-09: 730 mg via INTRAVENOUS
  Filled 2020-08-09: qty 73

## 2020-08-09 MED ORDER — HEPARIN SOD (PORK) LOCK FLUSH 100 UNIT/ML IV SOLN
500.0000 [IU] | Freq: Once | INTRAVENOUS | Status: AC | PRN
  Administered 2020-08-09: 500 [IU]
  Filled 2020-08-09: qty 5

## 2020-08-09 MED ORDER — SODIUM CHLORIDE 0.9 % IV SOLN
Freq: Once | INTRAVENOUS | Status: AC
  Filled 2020-08-09: qty 250

## 2020-08-09 MED ORDER — PALONOSETRON HCL INJECTION 0.25 MG/5ML
0.2500 mg | Freq: Once | INTRAVENOUS | Status: AC
  Administered 2020-08-09: 0.25 mg via INTRAVENOUS

## 2020-08-09 NOTE — Progress Notes (Signed)
Per Dr. Lorenso Courier - okay to treat with [facility] of 1.1.
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
## 2020-08-09 NOTE — Patient Instructions (Addendum)
[facility]   Discharge Instructions: Thank you for choosing [facility] to provide your oncology and hematology care.   If you have a lab appointment with the [facility], please go directly to the [facility] and check in at the registration area.   Wear comfortable clothing and clothing appropriate for easy access to any Portacath or PICC line.   We strive to give you quality time with your provider. You may need to reschedule your appointment if you arrive late (15 or more minutes).  Arriving late affects you and other patients whose appointments are after yours.  Also, if you miss three or more appointments without notifying the office, you may be dismissed from the clinic at the provider's discretion.      For prescription refill requests, have your pharmacy contact our office and allow 72 hours for refills to be completed.    Today you received the following chemotherapy and/or immunotherapy agents: Doxorubicin (Adriamycin), Vinblastine, Bleomycin, and Dacarbazine       To help prevent nausea and vomiting after your treatment, we encourage you to take your nausea medication as directed.  BELOW ARE SYMPTOMS THAT SHOULD BE REPORTED IMMEDIATELY: *FEVER GREATER THAN 100.4 F (38 C) OR HIGHER *CHILLS OR SWEATING *NAUSEA AND VOMITING THAT IS NOT CONTROLLED WITH YOUR NAUSEA MEDICATION *UNUSUAL SHORTNESS OF BREATH *UNUSUAL BRUISING OR BLEEDING *URINARY PROBLEMS (pain or burning when urinating, or frequent urination) *BOWEL PROBLEMS (unusual diarrhea, constipation, pain near the anus) TENDERNESS IN MOUTH AND THROAT WITH OR WITHOUT PRESENCE OF ULCERS (sore throat, sores in mouth, or a toothache) UNUSUAL RASH, SWELLING OR PAIN  UNUSUAL VAGINAL DISCHARGE OR ITCHING   Items with * indicate a potential emergency and should be followed up as soon as possible or go to the Emergency Department if any problems should occur.  Please show the  CHEMOTHERAPY ALERT CARD or IMMUNOTHERAPY ALERT CARD at check-in to the Emergency Department and triage nurse.  Should you have questions after your visit or need to cancel or reschedule your appointment, please contact [facility]  Dept: [phone]  and follow the prompts.  Office hours are 8:00 a.m. to 4:30 p.m. Monday - Friday. Please note that voicemails left after 4:00 p.m. may not be returned until the following business day.  We are closed weekends and major holidays. You have access to a nurse at all times for urgent questions. Please call the main number to the clinic Dept: [phone] and follow the prompts.   For any non-urgent questions, you may also contact your provider using MyChart. We now offer e-Visits for anyone 28 and older to request care online for non-urgent symptoms. For details visit [email or web address].   Also download the MyChart app! Go to the app store, search "MyChart", open the app, select [facility], and log in with your MyChart username and password.  Due to Covid, a mask is required upon entering the hospital/clinic. If you do not have a mask, one will be given to you upon arrival. For doctor visits, patients may have 1 support person aged 20 or older with them. For treatment visits, patients cannot have anyone with them due to current Covid guidelines and our immunocompromised population.   Implanted [facility] Guide An implanted port is a device that is placed under the skin. It is usually placed in the chest. The device can be used to give IV medicine, to take blood, or for dialysis. You may have an  implanted port if: You need IV medicine that would be irritating to the small veins in your hands or arms. You need IV medicines, such as antibiotics, for a long period of time. You need IV nutrition for a long period of time. You need dialysis. When you have a port, your health care provider can choose to use the port instead of  veins in your arms for these procedures. You may have fewer limitations when using a port than you would if you used other types of long-term IVs, and you will likely be able to return to normal activities afteryour incision heals. An implanted port has two main parts: Reservoir. The reservoir is the part where a needle is inserted to give medicines or draw blood. The reservoir is round. After it is placed, it appears as a small, raised area under your skin. Catheter. The catheter is a thin, flexible tube that connects the reservoir to a vein. Medicine that is inserted into the reservoir goes into the catheter and then into the vein. How is my port accessed? To access your port: A numbing cream may be placed on the skin over the port site. Your health care provider will put on a mask and sterile gloves. The skin over your port will be cleaned carefully with a germ-killing soap and allowed to dry. Your health care provider will gently pinch the port and insert a needle into it. Your health care provider will check for a blood return to make sure the port is in the vein and is not clogged. If your port needs to remain accessed to get medicine continuously (constant infusion), your health care provider will place a clear bandage (dressing) over the needle site. The dressing and needle will need to be changed every week, or as told by your health care provider. What is flushing? Flushing helps keep the port from getting clogged. Follow instructions from your health care provider about how and when to flush the port. Ports are usually flushed with saline solution or a medicine called heparin. The need for flushing will depend on how the port is used: If the port is only used from time to time to give medicines or draw blood, the port may need to be flushed: Before and after medicines have been given. Before and after blood has been drawn. As part of routine maintenance. Flushing may be recommended every  4-6 weeks. If a constant infusion is running, the port may not need to be flushed. Throw away any syringes in a disposal container that is meant for sharp items (sharps container). You can buy a sharps container from a pharmacy, or you can make one by using an empty hard plastic bottle with a cover. How long will my port stay implanted? The port can stay in for as long as your health care provider thinks it is needed. When it is time for the port to come out, a surgery will be done to remove it. The surgery will be similar to the procedure that was done to putthe port in. Follow these instructions at home:  Flush your port as told by your health care provider. If you need an infusion over several days, follow instructions from your health care provider about how to take care of your port site. Make sure you: Wash your hands with soap and water before you change your dressing. If soap and water are not available, use alcohol-based hand sanitizer. Change your dressing as told by your health care provider.  Place any used dressings or infusion bags into a plastic bag. Throw that bag in the trash. Keep the dressing that covers the needle clean and dry. Do not get it wet. Do not use scissors or sharp objects near the tube. Keep the tube clamped, unless it is being used. Check your port site every day for signs of infection. Check for: Redness, swelling, or pain. Fluid or blood. Pus or a bad smell. Protect the skin around the port site. Avoid wearing bra straps that rub or irritate the site. Protect the skin around your port from seat belts. Place a soft pad over your chest if needed. Bathe or shower as told by your health care provider. The site may get wet as long as you are not actively receiving an infusion. Return to your normal activities as told by your health care provider. Ask your health care provider what activities are safe for you. Carry a medical alert card or wear a medical alert  bracelet at all times. This will let health care providers know that you have an implanted port in case of an emergency. Get help right away if: You have redness, swelling, or pain at the port site. You have fluid or blood coming from your port site. You have pus or a bad smell coming from the port site. You have a fever. Summary Implanted ports are usually placed in the chest for long-term IV access. Follow instructions from your health care provider about flushing the port and changing bandages (dressings). Take care of the area around your port by avoiding clothing that puts pressure on the area, and by watching for signs of infection. Protect the skin around your port from seat belts. Place a soft pad over your chest if needed. Get help right away if you have a fever or you have redness, swelling, pain, drainage, or a bad smell at the port site. This information is not intended to replace advice given to you by your health care provider. Make sure you discuss any questions you have with your healthcare provider. Document Revised: 06/19/2019 Document Reviewed: 06/19/2019 Elsevier Patient Education  [place].

## 2020-08-15 ENCOUNTER — Other Ambulatory Visit (HOSPITAL_COMMUNITY): Payer: Self-pay | Admitting: Family Medicine

## 2020-08-15 DIAGNOSIS — Z95828 Presence of other vascular implants and grafts: Secondary | ICD-10-CM

## 2020-08-16 ENCOUNTER — Telehealth: Payer: Self-pay | Admitting: Hematology

## 2020-08-16 NOTE — Telephone Encounter (Signed)
Scheduled per los. Called and left msg on at [facility]. Faxed over copy of schedule. Left call back number on voicemail
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
## 2020-08-22 ENCOUNTER — Ambulatory Visit

## 2020-08-22 ENCOUNTER — Other Ambulatory Visit

## 2020-08-22 ENCOUNTER — Ambulatory Visit: Admitting: Hematology

## 2020-08-22 ENCOUNTER — Ambulatory Visit: Admitting: Hematology and Oncology

## 2020-08-22 NOTE — Progress Notes (Signed)
Called [facility]/ Nurse Sabra Heck to attempt to get pt rescheduled for infusion appointment that pt did not show up for today. Left message requesting a return call.

## 2020-08-23 ENCOUNTER — Ambulatory Visit: Admitting: Physician Assistant

## 2020-08-23 ENCOUNTER — Other Ambulatory Visit

## 2020-08-26 ENCOUNTER — Encounter: Payer: Self-pay | Admitting: Hematology

## 2020-08-26 ENCOUNTER — Other Ambulatory Visit: Payer: Self-pay

## 2020-08-26 ENCOUNTER — Ambulatory Visit (INDEPENDENT_AMBULATORY_CARE_PROVIDER_SITE_OTHER): Admitting: Student

## 2020-08-26 ENCOUNTER — Encounter: Payer: Self-pay | Admitting: Student

## 2020-08-26 ENCOUNTER — Ambulatory Visit (HOSPITAL_COMMUNITY)
Admission: RE | Admit: 2020-08-26 | Discharge: 2020-08-26 | Disposition: A | Discharge status: Home / Self Care | Source: Ambulatory Visit | Attending: Family Medicine | Admitting: Family Medicine

## 2020-08-26 VITALS — BP 130/70 | HR 60 | Ht 74.0 in | Wt 188.0 lb

## 2020-08-26 DIAGNOSIS — C8190 Hodgkin lymphoma, unspecified, unspecified site: Secondary | ICD-10-CM

## 2020-08-26 DIAGNOSIS — I3139 Other pericardial effusion (noninflammatory): Secondary | ICD-10-CM

## 2020-08-26 DIAGNOSIS — Z720 Tobacco use: Secondary | ICD-10-CM

## 2020-08-26 DIAGNOSIS — R0602 Shortness of breath: Secondary | ICD-10-CM

## 2020-08-26 DIAGNOSIS — Z452 Encounter for adjustment and management of vascular access device: Secondary | ICD-10-CM | POA: Insufficient documentation

## 2020-08-26 DIAGNOSIS — I313 Pericardial effusion (noninflammatory): Secondary | ICD-10-CM

## 2020-08-26 DIAGNOSIS — R002 Palpitations: Secondary | ICD-10-CM

## 2020-08-26 DIAGNOSIS — R569 Unspecified convulsions: Secondary | ICD-10-CM

## 2020-08-26 DIAGNOSIS — J90 Pleural effusion, not elsewhere classified: Secondary | ICD-10-CM

## 2020-08-26 DIAGNOSIS — Z95828 Presence of other vascular implants and grafts: Secondary | ICD-10-CM

## 2020-08-26 HISTORY — PX: IR CV LINE INJECTION: IMG2294

## 2020-08-26 MED ORDER — HEPARIN SOD (PORK) LOCK FLUSH 100 UNIT/ML IV SOLN
INTRAVENOUS | Status: AC
  Filled 2020-08-26: qty 5

## 2020-08-26 MED ORDER — IOHEXOL 300 MG/ML  SOLN
10.0000 mL | Freq: Once | INTRAMUSCULAR | Status: AC | PRN
  Administered 2020-08-26: 10 mL via INTRAVENOUS

## 2020-08-26 MED ORDER — HEPARIN SOD (PORK) LOCK FLUSH 100 UNIT/ML IV SOLN
INTRAVENOUS | Status: DC | PRN
  Administered 2020-08-26: 500 [IU] via INTRAVENOUS

## 2020-08-26 MED ORDER — IOHEXOL 300 MG/ML  SOLN: 10.0000 mL | Freq: Once | INTRAMUSCULAR | Status: AC | PRN

## 2020-08-26 NOTE — Patient Instructions (Signed)
Medication Instructions:  No Changes *If you need a refill on your cardiac medications before your next appointment, please call your pharmacy*   Lab Work: No labs If you have labs (blood work) drawn today and your tests are completely normal, you will receive your results only by: [place] (if you have MyChart) OR A paper copy in the mail If you have any lab test that is abnormal or we need to change your treatment, we will call you to review the results.   Testing/Procedures: No Testing   Follow-Up: At [facility], you and your health needs are our priority.  As part of our continuing mission to provide you with exceptional heart care, we have created designated Provider Care Teams.  These Care Teams include your primary Cardiologist (physician) and Advanced Practice Providers (APPs -  Physician Assistants and Nurse Practitioners) who all work together to provide you with the care you need, when you need it.  We recommend signing up for the patient portal called "MyChart".  Sign up information is provided on this After Visit Summary.  MyChart is used to connect with patients for Virtual Visits (Telemedicine).  Patients are able to view lab/test results, encounter notes, upcoming appointments, etc.  Non-urgent messages can be sent to your provider as well.   To learn more about what you can do with MyChart, go to [email or web address].    Your next appointment:   1 year(s)  The format for your next appointment:   In Person  Provider:   Eleonore Chiquito, MD

## 2020-08-26 NOTE — Procedures (Signed)
Pre Procedure Dx: Malfunctioning port Post Procedural Dx: Same  Potential transient difficulty with port aspiration secondary to non occlusive fibrin sheath which resolved with vigorous Port a catheter injection.  Port a catheter is ready for immediate usage.   Estimated Blood Loss: None Complications: None immediate.  Ronny Bacon, MD Pager #: [phone]

## 2020-09-02 ENCOUNTER — Telehealth: Payer: Self-pay | Admitting: Hematology

## 2020-09-02 NOTE — Telephone Encounter (Signed)
Scheduled appts per 7/15 sch msg. Nurse at [facility] is aware of appt.

## 2020-09-13 ENCOUNTER — Telehealth: Payer: Self-pay | Admitting: Hematology

## 2020-09-13 NOTE — Telephone Encounter (Signed)
R/s appts per 7/29 staff msg with RN Eustaquio Maize. Spoke to nurse at prison and she is aware of appt change.

## 2020-09-16 ENCOUNTER — Inpatient Hospital Stay: Payer: Self-pay

## 2020-09-16 ENCOUNTER — Inpatient Hospital Stay: Payer: Self-pay | Admitting: Hematology
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
## 2020-09-20 ENCOUNTER — Inpatient Hospital Stay (HOSPITAL_BASED_OUTPATIENT_CLINIC_OR_DEPARTMENT_OTHER): Payer: Self-pay | Admitting: Hematology

## 2020-09-20 ENCOUNTER — Inpatient Hospital Stay: Payer: Self-pay

## 2020-09-20 ENCOUNTER — Inpatient Hospital Stay: Payer: Self-pay | Attending: Hematology

## 2020-09-20 ENCOUNTER — Other Ambulatory Visit: Payer: Self-pay

## 2020-09-20 VITALS — BP 120/72 | HR 61 | Temp 98.3°F | Resp 18 | Wt 180.0 lb

## 2020-09-20 DIAGNOSIS — C8192 Hodgkin lymphoma, unspecified, intrathoracic lymph nodes: Secondary | ICD-10-CM | POA: Insufficient documentation

## 2020-09-20 DIAGNOSIS — Z5111 Encounter for antineoplastic chemotherapy: Secondary | ICD-10-CM | POA: Insufficient documentation

## 2020-09-20 DIAGNOSIS — C8172 Other classical Hodgkin lymphoma, intrathoracic lymph nodes: Secondary | ICD-10-CM

## 2020-09-20 DIAGNOSIS — Z79899 Other long term (current) drug therapy: Secondary | ICD-10-CM | POA: Insufficient documentation

## 2020-09-20 DIAGNOSIS — Z7189 Other specified counseling: Secondary | ICD-10-CM

## 2020-09-20 DIAGNOSIS — Z95828 Presence of other vascular implants and grafts: Secondary | ICD-10-CM

## 2020-09-20 LAB — SAMPLE TO BLOOD BANK

## 2020-09-20 LAB — CBC WITH DIFFERENTIAL/PLATELET
Abs Immature Granulocytes: 0.01 10*3/uL (ref 0.00–0.07)
Basophils Absolute: 0.1 10*3/uL (ref 0.0–0.1)
Basophils Relative: 1 %
Eosinophils Absolute: 0.3 10*3/uL (ref 0.0–0.5)
Eosinophils Relative: 6 %
HCT: 40.8 % (ref 39.0–52.0)
Hemoglobin: 13.1 g/dL (ref 13.0–17.0)
Immature Granulocytes: 0 %
Lymphocytes Relative: 31 %
Lymphs Abs: 1.5 10*3/uL (ref 0.7–4.0)
MCH: 27.9 pg (ref 26.0–34.0)
MCHC: 32.1 g/dL (ref 30.0–36.0)
MCV: 86.8 fL (ref 80.0–100.0)
Monocytes Absolute: 0.5 10*3/uL (ref 0.1–1.0)
Monocytes Relative: 11 %
Neutro Abs: 2.4 10*3/uL (ref 1.7–7.7)
Neutrophils Relative %: 51 %
Platelets: 174 10*3/uL (ref 150–400)
RBC: 4.7 MIL/uL (ref 4.22–5.81)
RDW: 15.3 % (ref 11.5–15.5)
WBC: 4.7 10*3/uL (ref 4.0–10.5)
nRBC: 0 % (ref 0.0–0.2)

## 2020-09-20 LAB — CMP (CANCER CENTER ONLY)
ALT: 20 U/L (ref 0–44)
AST: 18 U/L (ref 15–41)
Albumin: 4 g/dL (ref 3.5–5.0)
Alkaline Phosphatase: 44 U/L (ref 38–126)
Anion gap: 9 (ref 5–15)
BUN: 8 mg/dL (ref 6–20)
CO2: 25 mmol/L (ref 22–32)
Calcium: 9.6 mg/dL (ref 8.9–10.3)
Chloride: 107 mmol/L (ref 98–111)
Creatinine: 0.88 mg/dL (ref 0.61–1.24)
GFR, Estimated: >60 mL/min (ref >60)
Glucose, Bld: 89 mg/dL (ref 70–99)
Potassium: 3.9 mmol/L (ref 3.5–5.1)
Sodium: 141 mmol/L (ref 135–145)
Total Bilirubin: 0.5 mg/dL (ref 0.3–1.2)
Total Protein: 7.2 g/dL (ref 6.5–8.1)

## 2020-09-20 LAB — SEDIMENTATION RATE: Sed Rate: 5 mm/hr (ref 0–16)

## 2020-09-20 MED ORDER — SODIUM CHLORIDE 0.9 % IV SOLN
10.0000 mg | Freq: Once | INTRAVENOUS | Status: AC
  Administered 2020-09-20: 10 mg via INTRAVENOUS
  Filled 2020-09-20: qty 10

## 2020-09-20 MED ORDER — ENSURE COMPLETE PO LIQD: 237.0000 mL | Freq: Two times a day (BID) | ORAL | 1 refills | Status: AC

## 2020-09-20 MED ORDER — SODIUM CHLORIDE 0.9 % IV SOLN
10.0000 [IU]/m2 | Freq: Once | INTRAVENOUS | Status: AC
  Administered 2020-09-20: 20 [IU] via INTRAVENOUS
  Filled 2020-09-20: qty 6.67

## 2020-09-20 MED ORDER — DOXORUBICIN HCL CHEMO IV INJECTION 2 MG/ML
25.0000 mg/m2 | Freq: Once | INTRAVENOUS | Status: AC
  Administered 2020-09-20: 48 mg via INTRAVENOUS
  Filled 2020-09-20: qty 24

## 2020-09-20 MED ORDER — DIPHENHYDRAMINE HCL 50 MG/ML IJ SOLN: 50.0000 mg | Freq: Once | INTRAMUSCULAR | Status: DC | PRN

## 2020-09-20 MED ORDER — PALONOSETRON HCL INJECTION 0.25 MG/5ML
INTRAVENOUS | Status: AC
  Filled 2020-09-20: qty 5

## 2020-09-20 MED ORDER — SODIUM CHLORIDE 0.9% FLUSH
3.0000 mL | INTRAVENOUS | Status: DC | PRN
  Filled 2020-09-20: qty 10

## 2020-09-20 MED ORDER — ALTEPLASE 2 MG IJ SOLR
2.0000 mg | Freq: Once | INTRAMUSCULAR | Status: DC | PRN
  Filled 2020-09-20: qty 2

## 2020-09-20 MED ORDER — SODIUM CHLORIDE 0.9% FLUSH
10.0000 mL | Freq: Once | INTRAVENOUS | Status: AC
  Filled 2020-09-20: qty 10

## 2020-09-20 MED ORDER — SODIUM CHLORIDE 0.9% FLUSH
10.0000 mL | INTRAVENOUS | Status: DC | PRN
  Administered 2020-09-20: 10 mL
  Filled 2020-09-20: qty 10

## 2020-09-20 MED ORDER — PALONOSETRON HCL INJECTION 0.25 MG/5ML
0.2500 mg | Freq: Once | INTRAVENOUS | Status: AC
  Administered 2020-09-20: 0.25 mg via INTRAVENOUS

## 2020-09-20 MED ORDER — SODIUM CHLORIDE 0.9 % IV SOLN
150.0000 mg | Freq: Once | INTRAVENOUS | Status: AC
  Administered 2020-09-20: 150 mg via INTRAVENOUS
  Filled 2020-09-20: qty 150

## 2020-09-20 MED ORDER — ALBUTEROL SULFATE (2.5 MG/3ML) 0.083% IN NEBU
2.5000 mg | INHALATION_SOLUTION | Freq: Once | RESPIRATORY_TRACT | Status: DC | PRN
  Filled 2020-09-20: qty 3

## 2020-09-20 MED ORDER — SODIUM CHLORIDE 0.9 % IV SOLN
375.0000 mg/m2 | Freq: Once | INTRAVENOUS | Status: AC
  Administered 2020-09-20: 730 mg via INTRAVENOUS
  Filled 2020-09-20: qty 73

## 2020-09-20 MED ORDER — HEPARIN SOD (PORK) LOCK FLUSH 100 UNIT/ML IV SOLN
500.0000 [IU] | Freq: Once | INTRAVENOUS | Status: AC | PRN
  Administered 2020-09-20: 500 [IU]
  Filled 2020-09-20: qty 5

## 2020-09-20 MED ORDER — FAMOTIDINE 20 MG IN NS 100 ML IVPB
20.0000 mg | Freq: Once | INTRAVENOUS | Status: DC | PRN
Start: 2020-09-20 — End: 2020-09-20

## 2020-09-20 MED ORDER — VINBLASTINE SULFATE CHEMO INJECTION 1 MG/ML
6.1500 mg/m2 | Freq: Once | INTRAVENOUS | Status: AC
  Administered 2020-09-20: 12 mg via INTRAVENOUS
  Filled 2020-09-20: qty 12

## 2020-09-20 MED ORDER — SODIUM CHLORIDE 0.9 % IV SOLN
Freq: Once | INTRAVENOUS | Status: AC
  Filled 2020-09-20: qty 250

## 2020-09-20 MED ORDER — COLD PACK MISC ONCOLOGY
1.0000 | Freq: Once | Status: DC | PRN
  Filled 2020-09-20: qty 1

## 2020-09-20 MED ORDER — EPINEPHRINE 0.3 MG/0.3ML IJ SOAJ: 0.3000 mg | Freq: Once | INTRAMUSCULAR | Status: DC | PRN

## 2020-09-20 MED ORDER — METHYLPREDNISOLONE SODIUM SUCC 125 MG IJ SOLR: 125.0000 mg | Freq: Once | INTRAMUSCULAR | Status: DC | PRN

## 2020-09-20 MED ORDER — HOT PACK MISC ONCOLOGY
1.0000 | Freq: Once | Status: DC | PRN
  Filled 2020-09-20: qty 1

## 2020-09-20 MED ORDER — HEPARIN SOD (PORK) LOCK FLUSH 100 UNIT/ML IV SOLN
250.0000 [IU] | Freq: Once | INTRAVENOUS | Status: DC | PRN
  Filled 2020-09-20: qty 5

## 2020-09-20 MED ORDER — SODIUM CHLORIDE 0.9 % IV SOLN
Freq: Once | INTRAVENOUS | Status: DC | PRN
  Filled 2020-09-20: qty 250

## 2020-09-20 NOTE — Patient Instructions (Signed)
[facility] [place] ONCOLOGY  Discharge Instructions: Thank you for choosing [facility] to provide your oncology and hematology care.   If you have a lab appointment with the [facility], please go directly to the [facility] and check in at the registration area.   Wear comfortable clothing and clothing appropriate for easy access to any Portacath or PICC line.   We strive to give you quality time with your provider. You may need to reschedule your appointment if you arrive late (15 or more minutes).  Arriving late affects you and other patients whose appointments are after yours.  Also, if you miss three or more appointments without notifying the office, you may be dismissed from the clinic at the provider's discretion.      For prescription refill requests, have your pharmacy contact our office and allow 72 hours for refills to be completed.    Today you received the following chemotherapy and/or immunotherapy agents : Adriamycin, Bleomycin. Vinblastine, Dacarbazine     To help prevent nausea and vomiting after your treatment, we encourage you to take your nausea medication as directed.  BELOW ARE SYMPTOMS THAT SHOULD BE REPORTED IMMEDIATELY: *FEVER GREATER THAN 100.4 F (38 C) OR HIGHER *CHILLS OR SWEATING *NAUSEA AND VOMITING THAT IS NOT CONTROLLED WITH YOUR NAUSEA MEDICATION *UNUSUAL SHORTNESS OF BREATH *UNUSUAL BRUISING OR BLEEDING *URINARY PROBLEMS (pain or burning when urinating, or frequent urination) *BOWEL PROBLEMS (unusual diarrhea, constipation, pain near the anus) TENDERNESS IN MOUTH AND THROAT WITH OR WITHOUT PRESENCE OF ULCERS (sore throat, sores in mouth, or a toothache) UNUSUAL RASH, SWELLING OR PAIN  UNUSUAL VAGINAL DISCHARGE OR ITCHING   Items with * indicate a potential emergency and should be followed up as soon as possible or go to the Emergency Department if any problems should occur.  Please show the CHEMOTHERAPY ALERT CARD or  IMMUNOTHERAPY ALERT CARD at check-in to the Emergency Department and triage nurse.  Should you have questions after your visit or need to cancel or reschedule your appointment, please contact [facility]  Dept: [phone]  and follow the prompts.  Office hours are 8:00 a.m. to 4:30 p.m. Monday - Friday. Please note that voicemails left after 4:00 p.m. may not be returned until the following business day.  We are closed weekends and major holidays. You have access to a nurse at all times for urgent questions. Please call the main number to the clinic Dept: [phone] and follow the prompts.   For any non-urgent questions, you may also contact your provider using MyChart. We now offer e-Visits for anyone 60 and older to request care online for non-urgent symptoms. For details visit [email or web address].   Also download the MyChart app! Go to the app store, search "MyChart", open the app, select [facility], and log in with your MyChart username and password.  Due to Covid, a mask is required upon entering the hospital/clinic. If you do not have a mask, one will be given to you upon arrival. For doctor visits, patients may have 1 support person aged 28 or older with them. For treatment visits, patients cannot have anyone with them due to current Covid guidelines and our immunocompromised population.

## 2020-09-24 ENCOUNTER — Telehealth: Payer: Self-pay | Admitting: Hematology

## 2020-09-24 NOTE — Telephone Encounter (Signed)
Scheduled appts per 8/5 los. Spoke to Marketing executive at [facility]. They requested we fax over pt's upcoming appointments. I faxed updated calendar to [facility] scheduler.

## 2020-09-26 ENCOUNTER — Encounter: Payer: Self-pay | Admitting: Hematology

## 2020-10-03 MED FILL — Fosaprepitant Dimeglumine For IV Infusion 150 MG (Base Eq): INTRAVENOUS | Qty: 5 | Status: AC

## 2020-10-03 MED FILL — Dexamethasone Sodium Phosphate Inj 100 MG/10ML: INTRAMUSCULAR | Qty: 1 | Status: AC

## 2020-10-04 ENCOUNTER — Other Ambulatory Visit: Payer: Self-pay

## 2020-10-04 ENCOUNTER — Ambulatory Visit: Admitting: Hematology

## 2020-10-04 ENCOUNTER — Ambulatory Visit

## 2020-10-04 ENCOUNTER — Other Ambulatory Visit

## 2020-10-04 ENCOUNTER — Inpatient Hospital Stay: Payer: Self-pay

## 2020-10-04 ENCOUNTER — Inpatient Hospital Stay: Payer: Self-pay | Admitting: Hematology

## 2020-10-04 NOTE — Progress Notes (Signed)
Pt refused to be treated.  He states chemotherapy is killing him in prison.  MD and RN notified. MD's RN at bedside.  Educated the patient on benefits of treatment and risk of no treatment.  Pt verbalizes understanding.  Pt continues to refuse treatment.  Patient discharged in stable condition with guards.

## 2020-10-04 NOTE — Progress Notes (Signed)
Upon arrival to [facility] pt refused to have treatment. Pt stated he no longer wanted to get treatment due to the fact that the treatment makes him feel weak . Pt states feeling weak makes him vunerable in the prison, pt declines any further treatment. Pt declines any options of having a room in the hospital ward. Pt asked to leave and return to the prison with the guards. Dr Irene Limbo notified. Attempted to contact the prison to notify them that Pt does not want to return.

## 2020-10-18 ENCOUNTER — Other Ambulatory Visit

## 2020-10-18 ENCOUNTER — Ambulatory Visit

## 2020-10-18 ENCOUNTER — Inpatient Hospital Stay: Payer: Self-pay

## 2020-11-01 ENCOUNTER — Other Ambulatory Visit

## 2020-11-01 ENCOUNTER — Ambulatory Visit

## 2021-03-11 ENCOUNTER — Encounter: Payer: Self-pay | Admitting: Hematology

## 2021-09-08 ENCOUNTER — Other Ambulatory Visit: Payer: Self-pay

## 2022-12-27 IMAGING — DX DG CHEST 1V PORT
1 series · 1 of 1 positions shown · non-contrast
Comparison: 04/02/2020

CLINICAL DATA: Increasing shortness of breath

EXAM:
PORTABLE CHEST 1 VIEW

[chest ap]
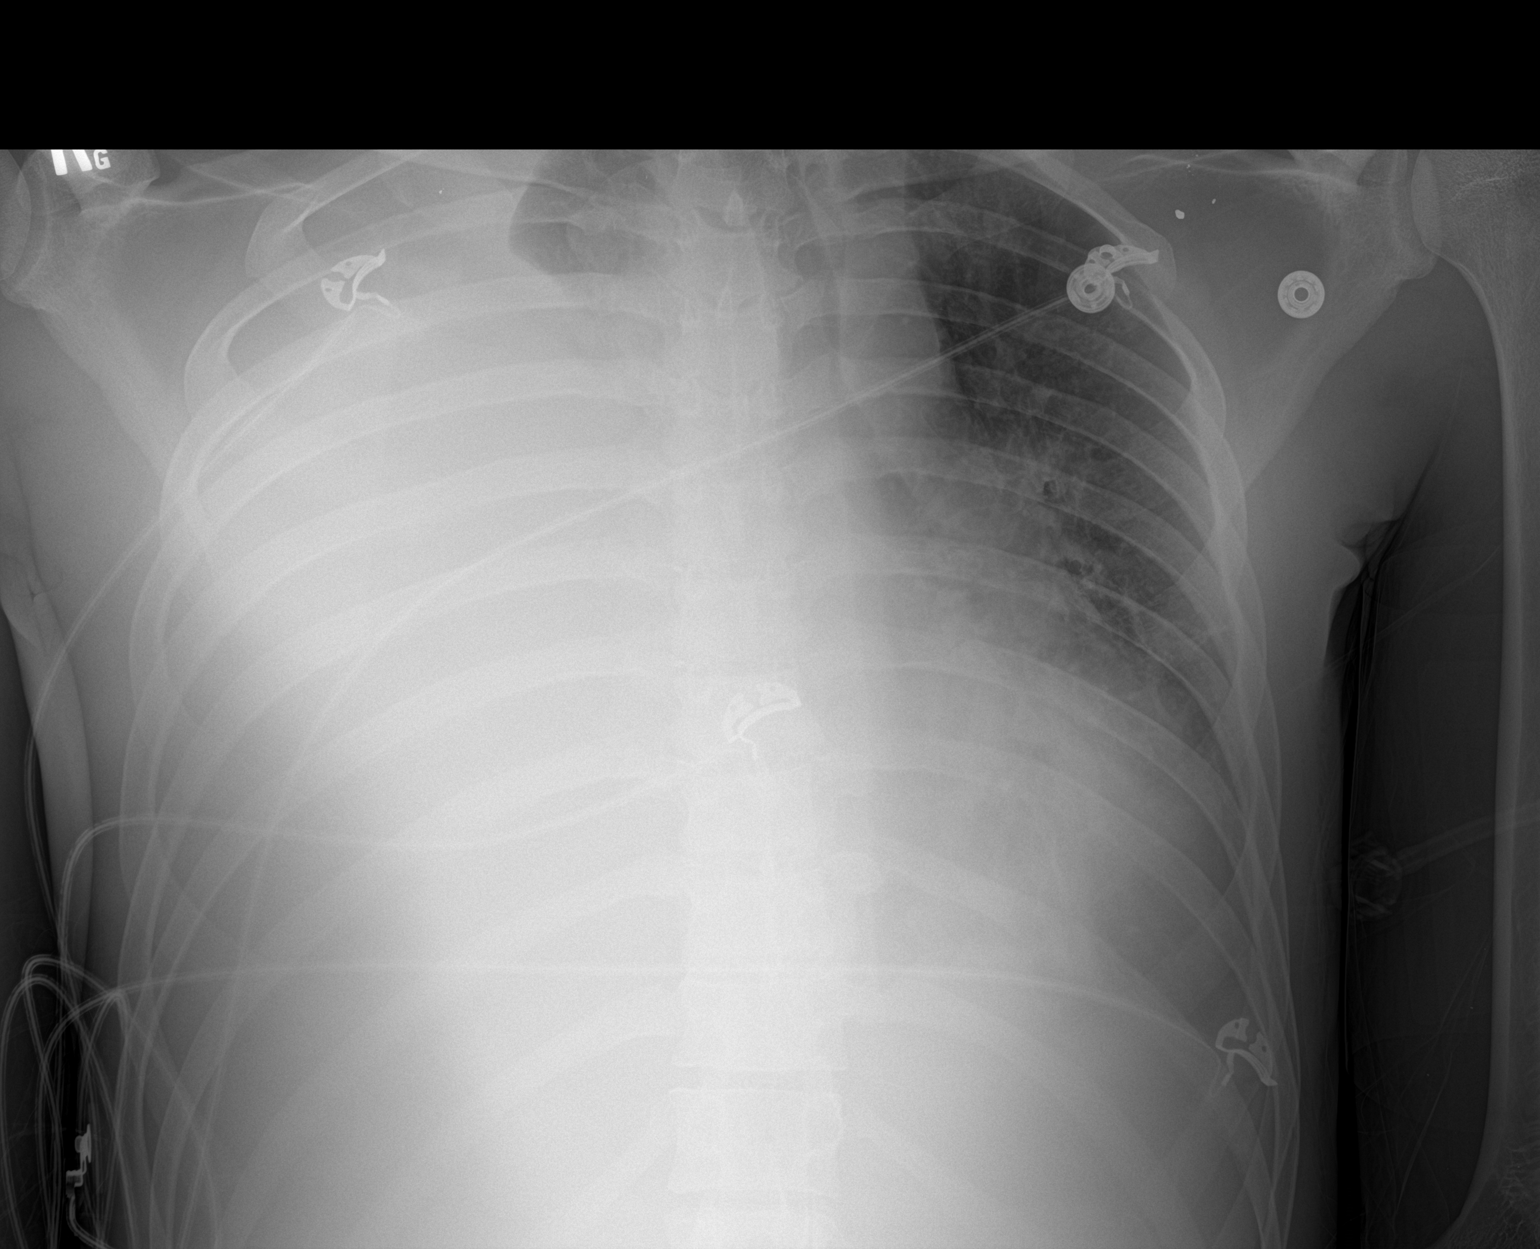

[1 of 1 positions shown; findings below may reference images not displayed]

FINDINGS: Near complete opacification of the right hemithorax, worsening since
prior study. Large right pleural effusion with shift of mediastinal
contours to the left. Only a very small amount of right upper lobe
is aerated. Perihilar and left lower lobe airspace disease,
increased since prior study.
IMPRESSION: Worsening right effusion with near complete opacification of the
right hemithorax.

Worsening left perihilar and lower lobe airspace disease.

## 2022-12-30 IMAGING — US US ABDOMEN LIMITED RUQ/ASCITES
1 series · 14 of 25 positions shown · non-contrast
Comparison: None.

CLINICAL DATA: Elevated liver enzymes

EXAM:
ULTRASOUND ABDOMEN LIMITED RIGHT UPPER QUADRANT

[Series 1: abdomen us · 14 of 43 slices shown]
[im 1/43]
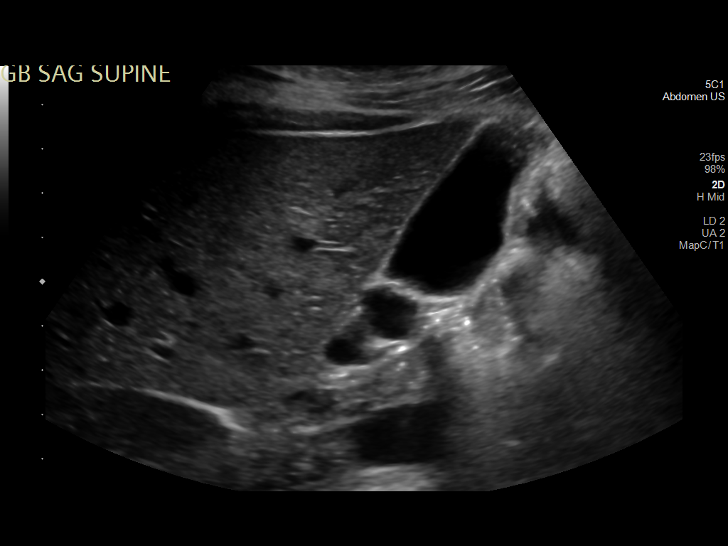
[im 4/43]
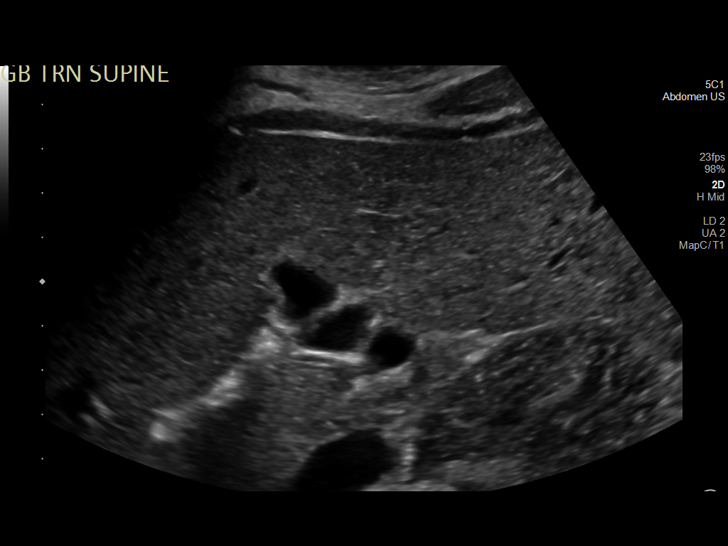
[im 8/43]
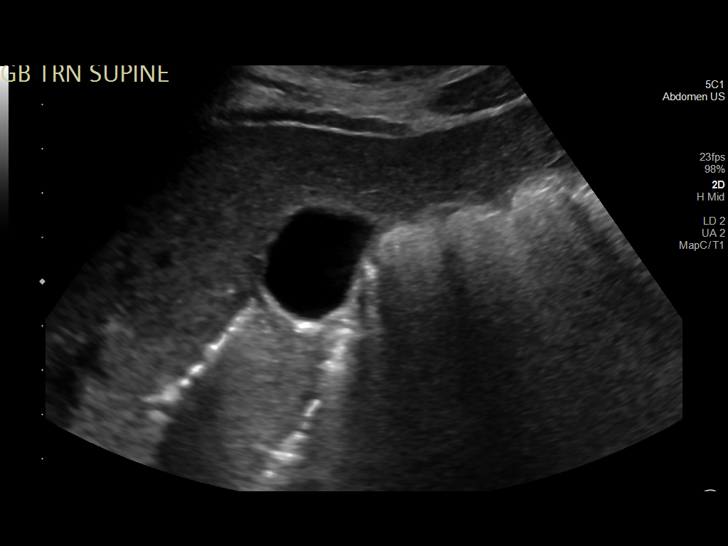
[im 11/43]
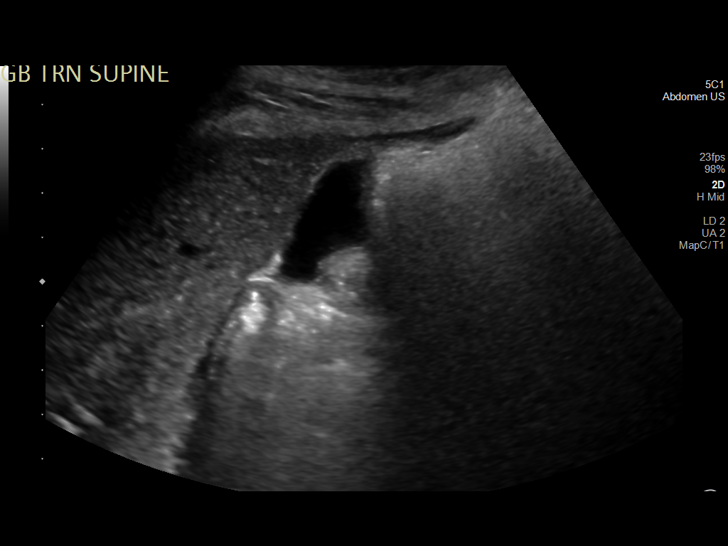
[im 15/43]
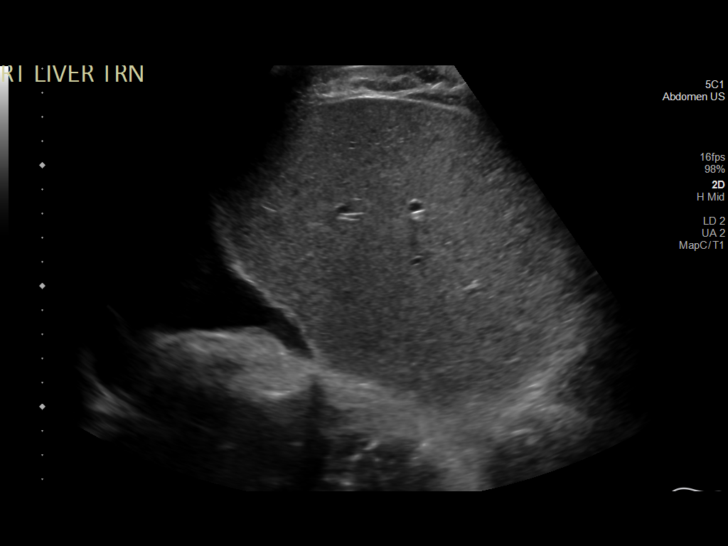
[im 16/43]
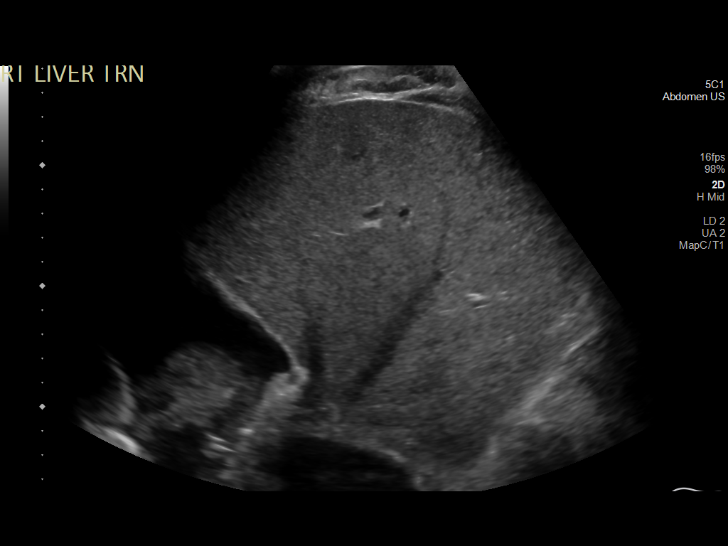
[im 20/43]
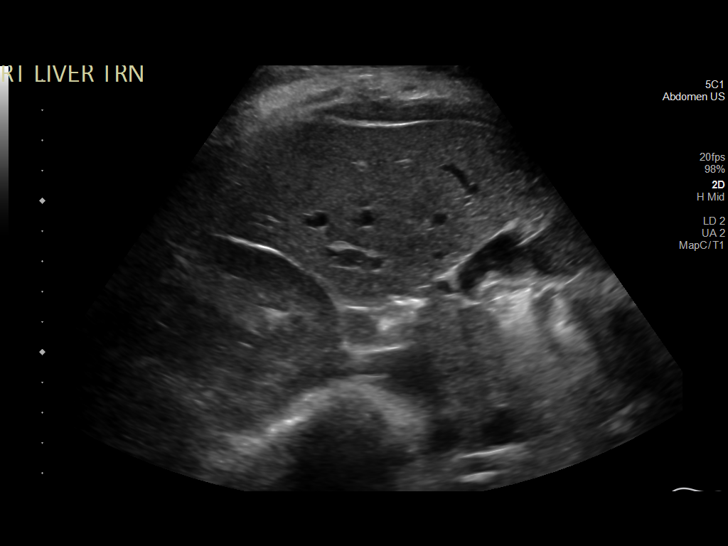
[im 23/43]
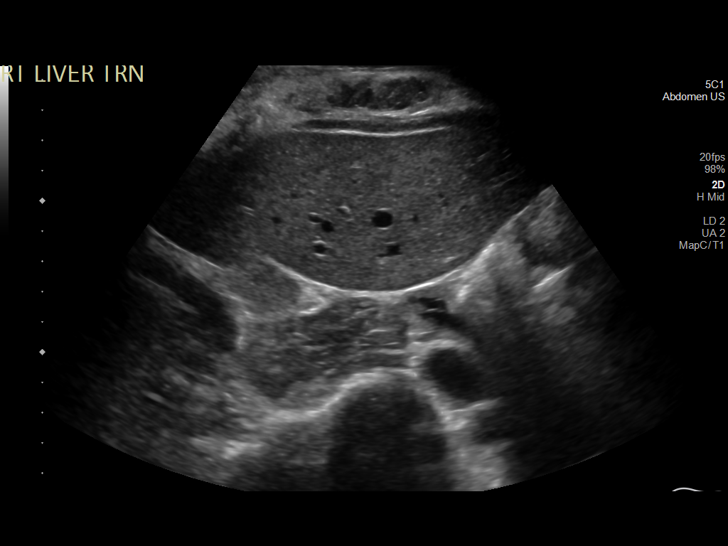
[im 27/43]
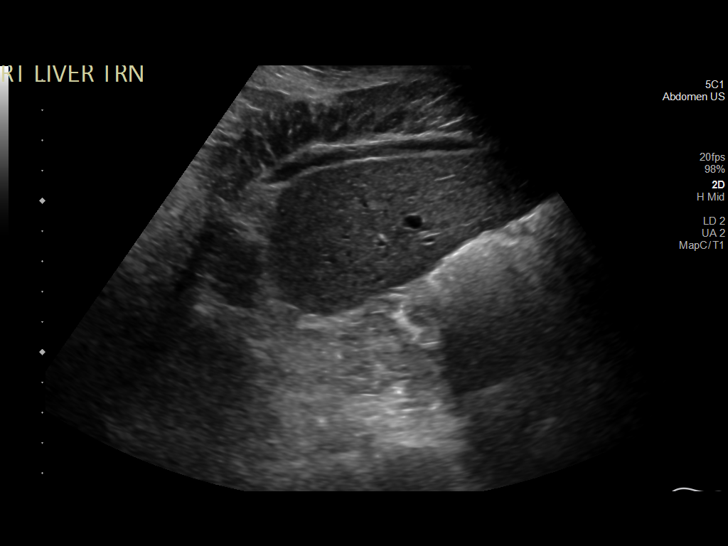
[im 29/43]
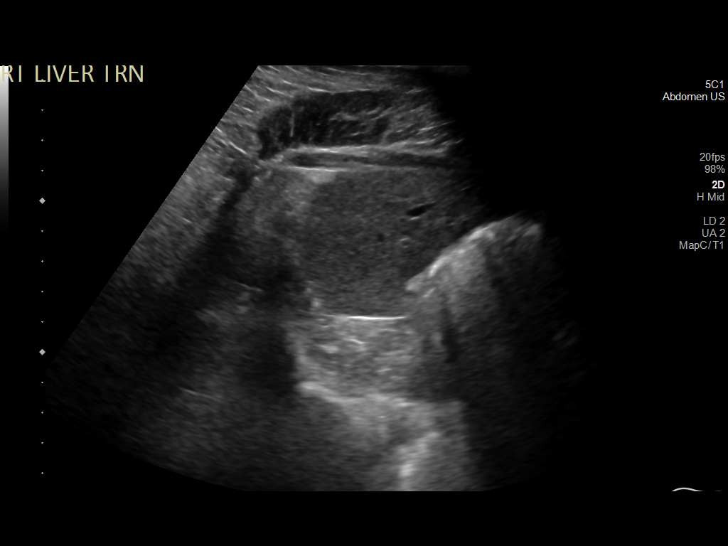
[im 32/43]
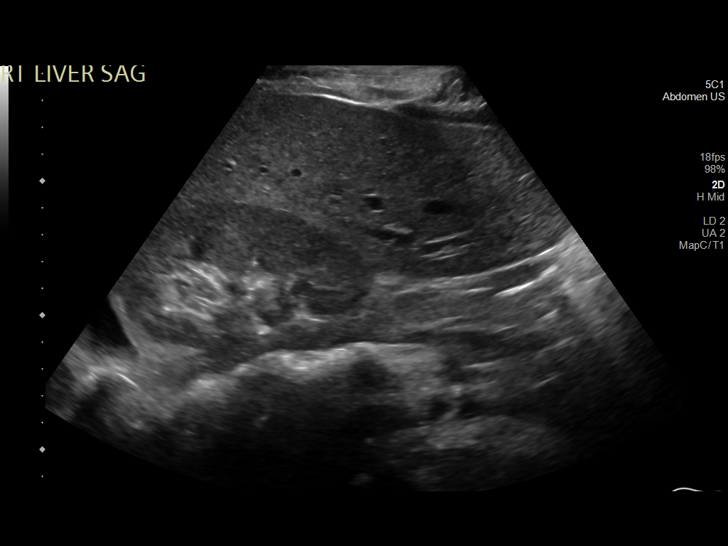
[im 36/43]
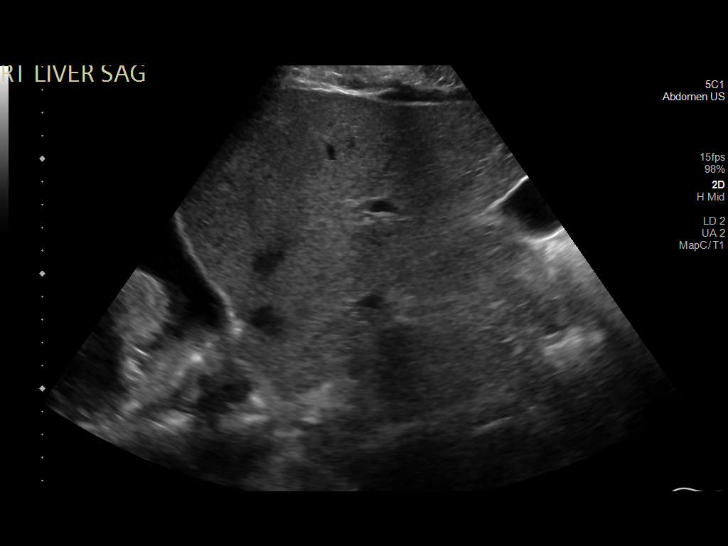
[im 39/43]
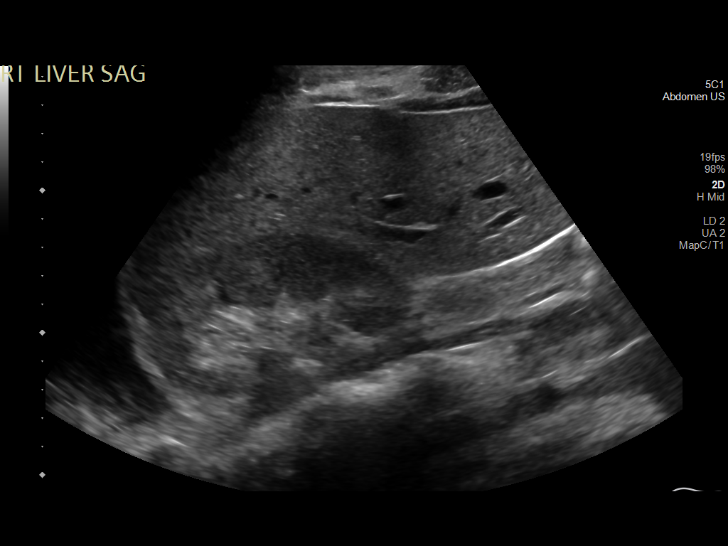
[im 43/43]
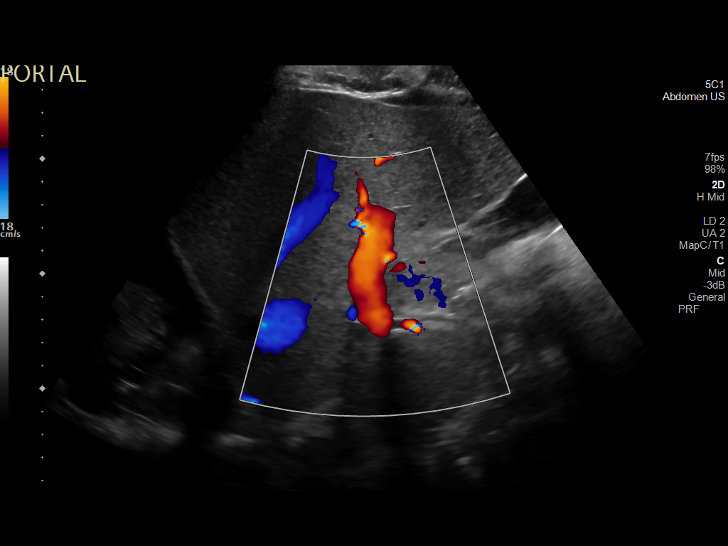

[14 of 25 positions shown; findings below may reference images not displayed]

FINDINGS: Gallbladder:

No gallstones or wall thickening visualized. No sonographic Murphy
sign noted by sonographer.

Common bile duct:

Diameter: 1.9 mm

Liver:

The left lobe is not well visualized. Visualized liver is normal.
Portal vein is patent on color Doppler imaging with normal direction
of blood flow towards the liver.

Other: Moderate right pleural effusion.
IMPRESSION: 1. Poor visualization of the left hepatic lobe.
2. Moderate right pleural effusion.

## 2023-01-03 IMAGING — US IR CHEST US
1 series · 6 of 6 positions shown · non-contrast
Comparison: CT guided chest biopsy 04/02/2020

CLINICAL DATA: 24-year-old male with right hemithorax opacification
presents today measure radiology for thoracentesis.

EXAM:
CHEST ULTRASOUND

[Series 1: ir (id) (id)/(id)/(id) ir · 6 of 6 slices shown]
[im 1/6]
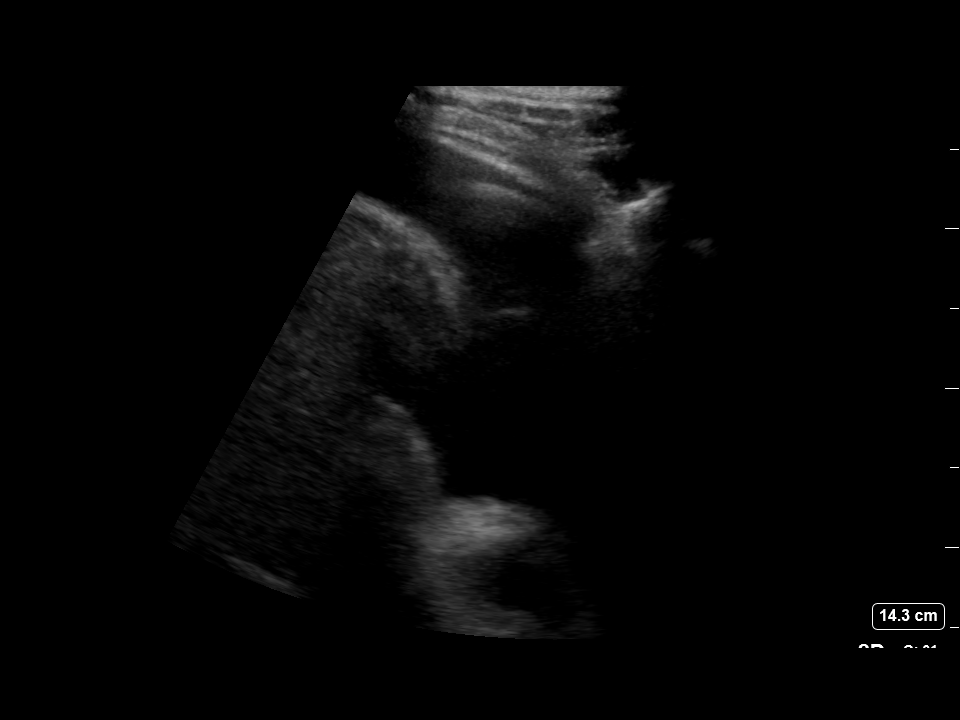
[im 2/6]
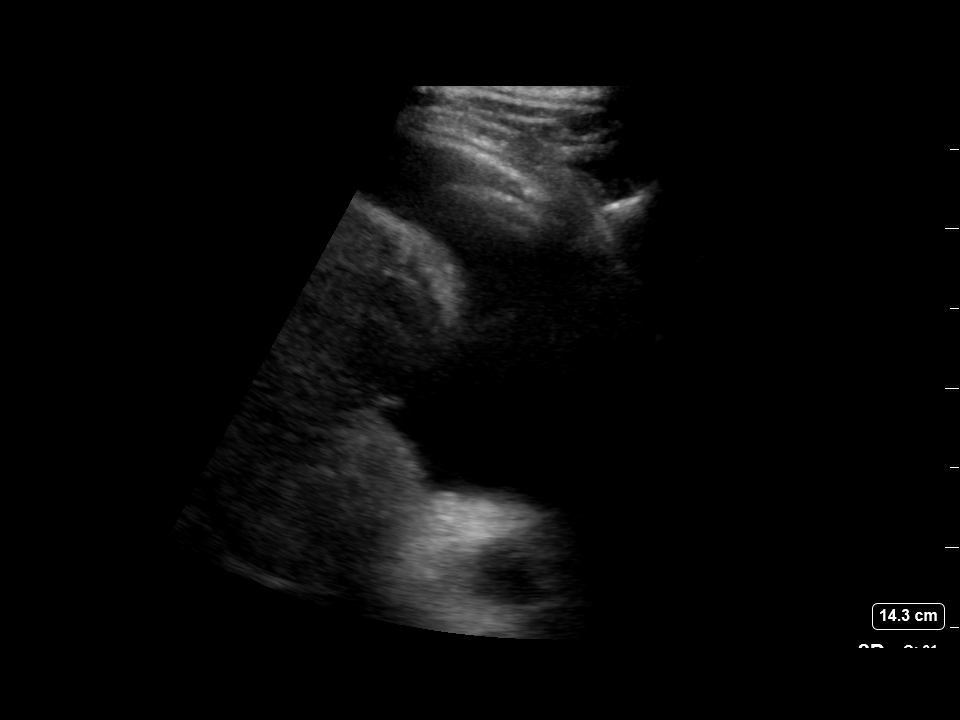
[im 3/6]
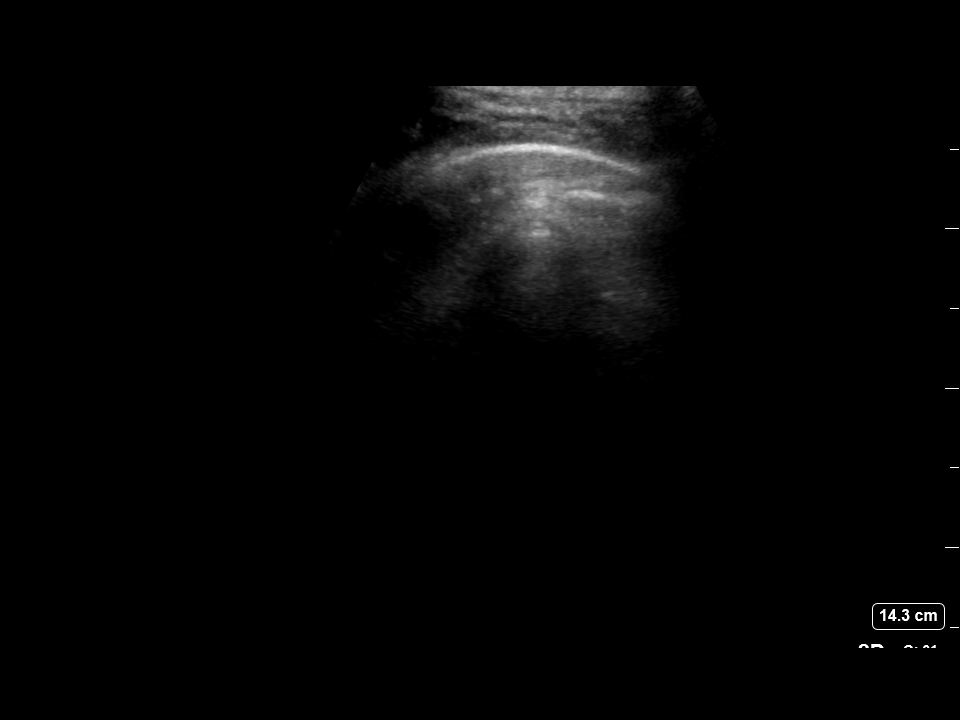
[im 4/6]
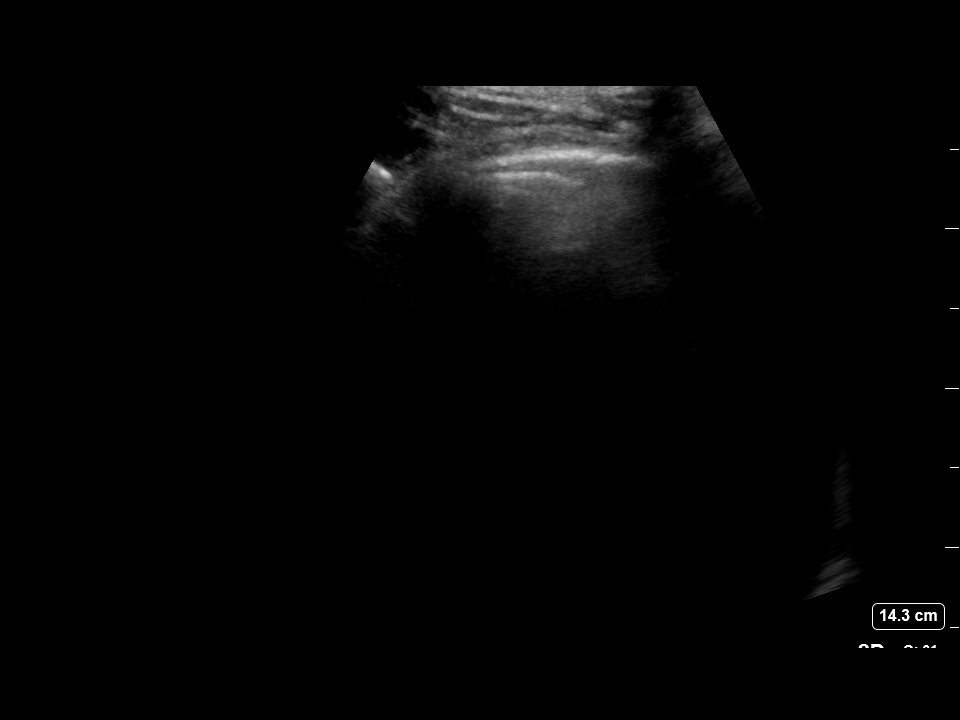
[im 5/6]
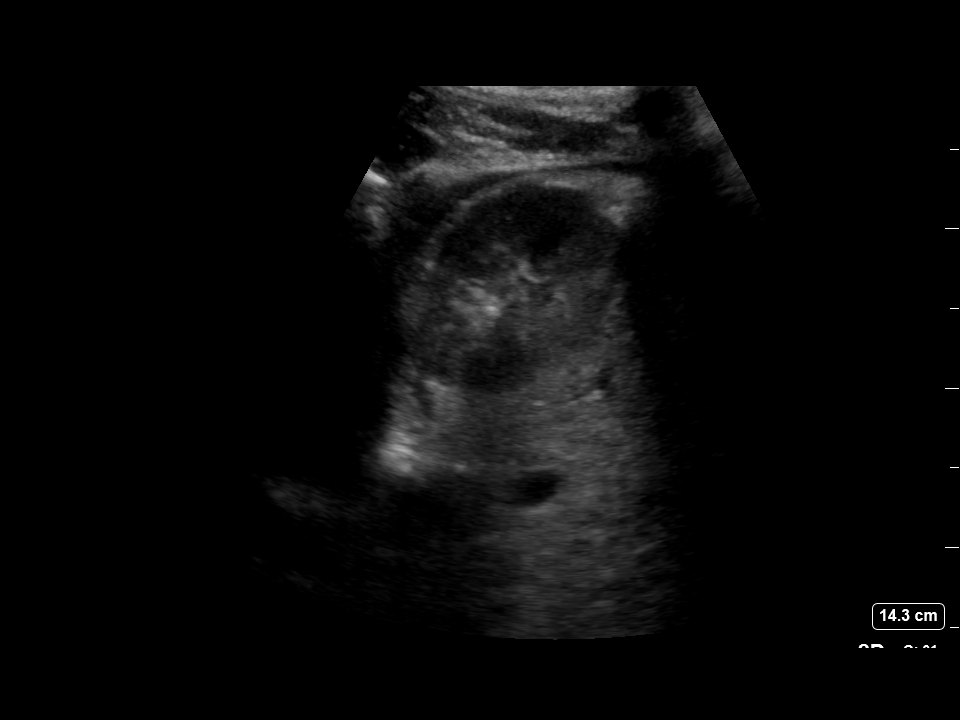
[im 6/6]
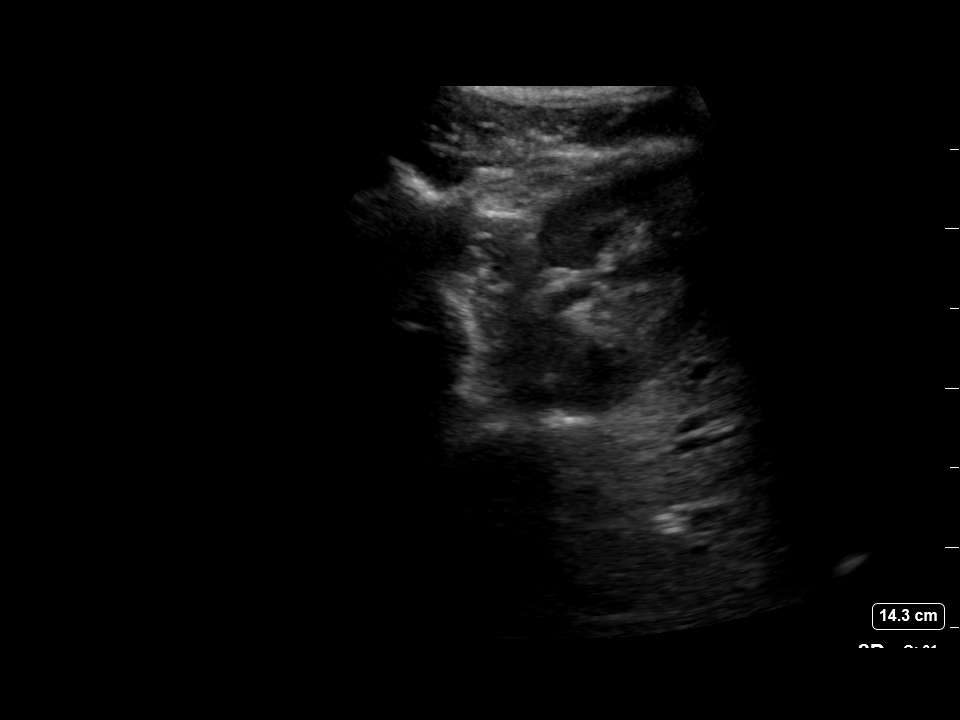

[6 of 6 positions shown; findings below may reference images not displayed]

FINDINGS: Minimal right pleural fluid, insufficient for aspiration.
IMPRESSION: Minimal right pleural effusion, insufficient for aspiration. Right
chest opacification predominantly due to known mass.

## 2023-03-16 IMAGING — XA IR IMAGING GUIDED PORT INSERTION
1 series · 1 of 1 positions shown · non-contrast
Comparison: none

CLINICAL DATA: Hodgkin's lymphoma and need for porta cath to begin
chemotherapy.

[Series 300: ir imaging guided port insertion · 1 of 1 slices shown]
[im 1/1]
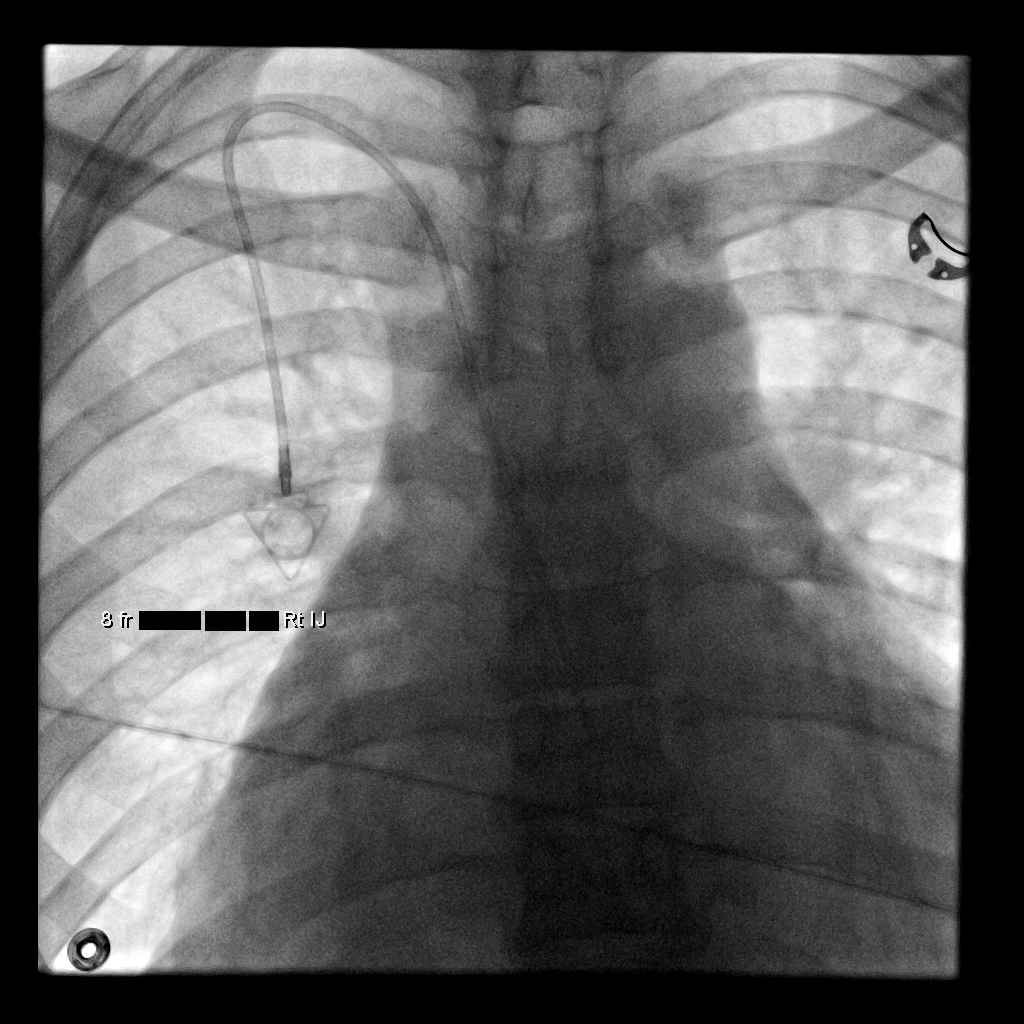

[1 of 1 positions shown; findings below may reference images not displayed]

EXAM:
IMPLANTED PORT A CATH PLACEMENT WITH ULTRASOUND AND FLUOROSCOPIC
GUIDANCE

ANESTHESIA/SEDATION:
2.0 mg IV Versed; 100 mcg IV Fentanyl

Total Moderate Sedation Time:  44 minutes

The patient's level of consciousness and physiologic status were
continuously monitored during the procedure by Radiology nursing.

FLUOROSCOPY TIME:  1 minute.  6.0 mGy.

PROCEDURE:
The procedure, risks, benefits, and alternatives were explained to
the patient. Questions regarding the procedure were encouraged and
answered. The patient understands and consents to the procedure. A
time-out was performed prior to initiating the procedure.

Ultrasound was utilized to confirm patency of the right internal
jugular vein. The right neck and chest were prepped with
chlorhexidine in a sterile fashion, and a sterile drape was applied
covering the operative field. Maximum barrier sterile technique with
sterile gowns and gloves were used for the procedure. Local
anesthesia was provided with 1% lidocaine.

After creating a small venotomy incision, a 21 gauge needle was
advanced into the right internal jugular vein under direct,
real-time ultrasound guidance. Ultrasound image documentation was
performed. After securing guidewire access, an 8 Fr dilator was
placed. A J-wire was kinked to measure appropriate catheter length.

A subcutaneous port pocket was then created along the upper chest
wall utilizing sharp and blunt dissection. Portable cautery was
utilized. The pocket was irrigated with sterile saline.

A single lumen power injectable port was chosen for placement. The 8
Fr catheter was tunneled from the port pocket site to the venotomy
incision. The port was placed in the pocket. External catheter was
trimmed to appropriate length based on guidewire measurement.

At the venotomy, an 8 Fr peel-away sheath was placed over a
guidewire. The catheter was then placed through the sheath and the
sheath removed. Final catheter positioning was confirmed and
documented with a fluoroscopic spot image. The port was accessed
with a needle and aspirated and flushed with heparinized saline. The
access needle was removed.

The venotomy and port pocket incisions were closed with subcutaneous
3-0 Monocryl and subcuticular 4-0 Vicryl. Dermabond was applied to
both incisions.

COMPLICATIONS:
COMPLICATIONS
None
FINDINGS: After catheter placement, the tip lies at the [place] junction.
The catheter aspirates normally and is ready for immediate use.
IMPRESSION: Placement of single lumen port a cath via right internal jugular
vein. The catheter tip lies at the [place] junction. A power
injectable port a cath was placed and is ready for immediate use.
